# Patient Record
Sex: Female | Born: 1945 | Race: White | Hispanic: No | Smoking: Current every day smoker
Health system: Southern US, Community
[De-identification: ages and names within clinical notes are randomized; demographics above are authoritative.]

## PROBLEM LIST (undated history)

## (undated) DIAGNOSIS — E7800 Pure hypercholesterolemia, unspecified: Secondary | ICD-10-CM

## (undated) DIAGNOSIS — S32009A Unspecified fracture of unspecified lumbar vertebra, initial encounter for closed fracture: Secondary | ICD-10-CM

## (undated) DIAGNOSIS — R079 Chest pain, unspecified: Secondary | ICD-10-CM

## (undated) DIAGNOSIS — R002 Palpitations: Secondary | ICD-10-CM

## (undated) DIAGNOSIS — F17200 Nicotine dependence, unspecified, uncomplicated: Secondary | ICD-10-CM

## (undated) DIAGNOSIS — J449 Chronic obstructive pulmonary disease, unspecified: Secondary | ICD-10-CM

## (undated) DIAGNOSIS — Z8619 Personal history of other infectious and parasitic diseases: Secondary | ICD-10-CM

## (undated) DIAGNOSIS — E119 Type 2 diabetes mellitus without complications: Secondary | ICD-10-CM

## (undated) HISTORY — DX: Pure hypercholesterolemia, unspecified: E78.00

## (undated) HISTORY — DX: Unspecified fracture of unspecified lumbar vertebra, initial encounter for closed fracture: S32.009A

## (undated) HISTORY — DX: Chest pain, unspecified: R07.9

## (undated) HISTORY — DX: Palpitations: R00.2

## (undated) HISTORY — PX: ABDOMINAL HYSTERECTOMY: SHX81

## (undated) HISTORY — DX: Personal history of other infectious and parasitic diseases: Z86.19

## (undated) HISTORY — PX: CHOLECYSTECTOMY: SHX55

## (undated) HISTORY — PX: OTHER SURGICAL HISTORY: SHX169

## (undated) HISTORY — PX: APPENDECTOMY: SHX54

---

## 1999-06-29 ENCOUNTER — Emergency Department (HOSPITAL_COMMUNITY): Admission: EM | Admit: 1999-06-29 | Discharge: 1999-06-29 | Payer: Self-pay | Admitting: Emergency Medicine

## 2006-10-10 ENCOUNTER — Ambulatory Visit (HOSPITAL_COMMUNITY): Payer: Self-pay | Admitting: Psychiatry

## 2006-12-07 ENCOUNTER — Ambulatory Visit (HOSPITAL_COMMUNITY): Payer: Self-pay | Admitting: Psychiatry

## 2007-01-03 ENCOUNTER — Ambulatory Visit (HOSPITAL_COMMUNITY): Payer: Self-pay | Admitting: Psychiatry

## 2007-02-05 ENCOUNTER — Ambulatory Visit (HOSPITAL_COMMUNITY): Payer: Self-pay | Admitting: Psychiatry

## 2007-03-12 ENCOUNTER — Ambulatory Visit (HOSPITAL_COMMUNITY): Payer: Self-pay | Admitting: Psychiatry

## 2007-03-18 ENCOUNTER — Ambulatory Visit (HOSPITAL_COMMUNITY): Payer: Self-pay | Admitting: Psychiatry

## 2007-05-08 ENCOUNTER — Ambulatory Visit (HOSPITAL_COMMUNITY): Payer: Self-pay | Admitting: Psychiatry

## 2007-07-22 ENCOUNTER — Ambulatory Visit (HOSPITAL_COMMUNITY): Payer: Self-pay | Admitting: Psychiatry

## 2007-09-24 ENCOUNTER — Ambulatory Visit (HOSPITAL_COMMUNITY): Payer: Self-pay | Admitting: Psychiatry

## 2007-11-18 ENCOUNTER — Ambulatory Visit (HOSPITAL_COMMUNITY): Payer: Self-pay | Admitting: Psychiatry

## 2008-01-15 ENCOUNTER — Ambulatory Visit (HOSPITAL_COMMUNITY): Payer: Self-pay | Admitting: Psychiatry

## 2008-03-13 ENCOUNTER — Ambulatory Visit (HOSPITAL_COMMUNITY): Payer: Self-pay | Admitting: Psychiatry

## 2009-03-13 ENCOUNTER — Ambulatory Visit: Payer: Self-pay | Admitting: Diagnostic Radiology

## 2009-03-13 ENCOUNTER — Emergency Department (HOSPITAL_BASED_OUTPATIENT_CLINIC_OR_DEPARTMENT_OTHER): Admission: EM | Admit: 2009-03-13 | Discharge: 2009-03-13 | Payer: Self-pay | Admitting: Emergency Medicine

## 2009-03-19 ENCOUNTER — Emergency Department (HOSPITAL_BASED_OUTPATIENT_CLINIC_OR_DEPARTMENT_OTHER): Admission: EM | Admit: 2009-03-19 | Discharge: 2009-03-19 | Payer: Self-pay | Admitting: Emergency Medicine

## 2009-04-28 ENCOUNTER — Emergency Department (HOSPITAL_BASED_OUTPATIENT_CLINIC_OR_DEPARTMENT_OTHER): Admission: EM | Admit: 2009-04-28 | Discharge: 2009-04-28 | Payer: Self-pay | Admitting: Emergency Medicine

## 2009-07-15 ENCOUNTER — Encounter: Admission: RE | Admit: 2009-07-15 | Discharge: 2009-07-15 | Payer: Self-pay | Admitting: Orthopedic Surgery

## 2011-03-07 DIAGNOSIS — Z8619 Personal history of other infectious and parasitic diseases: Secondary | ICD-10-CM

## 2011-03-07 HISTORY — DX: Personal history of other infectious and parasitic diseases: Z86.19

## 2011-04-23 DIAGNOSIS — F411 Generalized anxiety disorder: Secondary | ICD-10-CM | POA: Insufficient documentation

## 2011-09-08 ENCOUNTER — Ambulatory Visit: Payer: Self-pay | Admitting: Family Medicine

## 2011-09-08 DIAGNOSIS — Z0289 Encounter for other administrative examinations: Secondary | ICD-10-CM

## 2011-09-12 ENCOUNTER — Encounter: Payer: Self-pay | Admitting: *Deleted

## 2011-09-12 ENCOUNTER — Emergency Department: Admission: EM | Admit: 2011-09-12 | Discharge: 2011-09-12 | Payer: Medicaid Other | Source: Home / Self Care

## 2011-10-24 ENCOUNTER — Encounter: Payer: Self-pay | Admitting: Cardiology

## 2011-10-24 ENCOUNTER — Encounter: Payer: Self-pay | Admitting: *Deleted

## 2011-10-25 ENCOUNTER — Encounter: Payer: Self-pay | Admitting: Cardiology

## 2011-10-25 ENCOUNTER — Ambulatory Visit (INDEPENDENT_AMBULATORY_CARE_PROVIDER_SITE_OTHER): Payer: Medicaid Other | Admitting: Cardiology

## 2011-10-25 VITALS — BP 112/81 | HR 105 | Ht 68.0 in | Wt 160.0 lb

## 2011-10-25 DIAGNOSIS — F17200 Nicotine dependence, unspecified, uncomplicated: Secondary | ICD-10-CM

## 2011-10-25 DIAGNOSIS — R002 Palpitations: Secondary | ICD-10-CM | POA: Insufficient documentation

## 2011-10-25 DIAGNOSIS — Z720 Tobacco use: Secondary | ICD-10-CM

## 2011-10-25 DIAGNOSIS — R079 Chest pain, unspecified: Secondary | ICD-10-CM

## 2011-10-25 NOTE — Assessment & Plan Note (Signed)
Patient counseled on discontinuing. 

## 2011-10-25 NOTE — Assessment & Plan Note (Signed)
Plan [facility]. 

## 2011-10-25 NOTE — Assessment & Plan Note (Signed)
Symptoms are extremely atypical but electrocardiogram with question previous infarct. Schedule lexiscan myoview for risk stratification.

## 2011-10-25 NOTE — Patient Instructions (Addendum)
Your physician recommends that you schedule a follow-up appointment in: 6 WEEKS WITH DR Jens Som IN [place]  Your physician has requested that you have a lexiscan myoview. For further information please visit [email or web address] Please follow instruction sheet, as given.   Your physician has recommended that you wear an event monitor. Event monitors are medical devices that record the heart's electrical activity. Doctors most often [place] these monitors to diagnose arrhythmias. Arrhythmias are problems with the speed or rhythm of the heartbeat. The monitor is a small, portable device. You can wear one while you do your normal daily activities. This is usually used to diagnose what is causing palpitations/syncope (passing out).

## 2011-10-25 NOTE — Progress Notes (Signed)
  HPI: 66 year old female with no prior cardiac history for evaluation of chest pain and palpitations. Patient describes chest pain for approximately 3 months. It begins in the left breast area and radiates to the right. It is described as a sharp pain with occasional nausea, shortness of breath and diaphoresis. It increases with inspiration and certain movements. It is not related to food or exertion. It lasts 45 seconds and resolves spontaneously. She also has dyspnea on exertion but no orthopnea or PND. Occasional mild pedal edema. She also describes palpitations for one month. These are sudden in onset and described as heart racing. These last 45 seconds and resolve spontaneously. No syncope.  Current Outpatient Prescriptions  Medication Sig Dispense Refill  . ALPRAZolam (XANAX) 1 MG tablet Take 1 mg by mouth as needed.      Marland Kitchen aspirin 81 MG tablet Take 81 mg by mouth daily.      Marland Kitchen HYDROcodone-acetaminophen (VICODIN) 5-500 MG per tablet Take 1 tablet by mouth every 6 (six) hours as needed.      . pregabalin (LYRICA) 150 MG capsule Take 150 mg by mouth 3 (three) times daily.      . QUEtiapine (SEROQUEL) 100 MG tablet Take 100 mg by mouth at bedtime.      . topiramate (TOPAMAX) 200 MG tablet Take 200 mg by mouth daily.         No Known Allergies  Past Medical History  Diagnosis Date  . Hypercholesteremia   . Lumbar vertebral fracture     Past Surgical History  Procedure Date  . Cholecystectomy   . Abdominal hysterectomy   . Appendectomy     History   Social History  . Marital Status: Divorced    Spouse Name: N/A    Number of Children: 3  . Years of Education: N/A   Occupational History  .      Retired   Social History Main Topics  . Smoking status: Current Everyday Smoker  . Smokeless tobacco: Not on file  . Alcohol Use: No  . Drug Use: No  . Sexually Active:    Other Topics Concern  . Not on file   Social History Narrative  . No narrative on file    Family  History  Problem Relation Age of Onset  . Hypertension Father   . Heart disease Sister     CHF    ROS: problems with right arm pain but no fevers or chills, productive cough, hemoptysis, dysphasia, odynophagia, melena, hematochezia, dysuria, hematuria, rash, seizure activity, orthopnea, PND, pedal edema, claudication. Remaining systems are negative.  Physical Exam:   Blood pressure 112/81, pulse 105, height 5\' 8"  (1.727 m), weight 160 lb (72.576 kg).  General:  Well developed/well nourished in NAD Skin warm/dry Patient not depressed No peripheral clubbing Back-normal HEENT-normal/normal eyelids Neck supple/normal carotid upstroke bilaterally; no bruits; no JVD; no thyromegaly chest - CTA/ normal expansion CV - RRR/normal S1 and S2; no murmurs, rubs or gallops;  PMI nondisplaced Abdomen -NT/ND, no HSM, no mass, + bowel sounds, no bruit 2+ femoral pulses, no bruits Ext-no edema, chords, 2+ DP Neuro-grossly nonfocal  ECG sinus rhythm at a rate of 100. Cannot rule out prior inferior posterior lateral infarct. Right axis deviation. Nonspecific ST changes.

## 2011-11-09 ENCOUNTER — Other Ambulatory Visit (HOSPITAL_COMMUNITY): Payer: Medicaid Other

## 2011-12-19 ENCOUNTER — Encounter: Payer: Self-pay | Admitting: *Deleted

## 2011-12-20 ENCOUNTER — Ambulatory Visit: Payer: Medicaid Other | Admitting: Cardiology

## 2012-01-15 ENCOUNTER — Telehealth: Payer: Self-pay | Admitting: *Deleted

## 2012-01-15 NOTE — Telephone Encounter (Signed)
Spoke to Gina Boyd today concerning Myoview/Monitor that was ordered by Dr. Jens Som on 10/25/11. Patient was originally scheduled for testing on 11/09/11 she canceled due to transportation issues. Gina Boyd has decided to put-off testing at this time along with follow-up appointment with [facility]. Gina Boyd is dealing with some family issues right now that takes precedence over everything else. She will call our office if she changes her mind or have any other problems.

## 2012-03-22 ENCOUNTER — Encounter: Payer: Self-pay | Admitting: Family Medicine

## 2012-03-22 ENCOUNTER — Ambulatory Visit (INDEPENDENT_AMBULATORY_CARE_PROVIDER_SITE_OTHER): Payer: Medicare Other | Admitting: Family Medicine

## 2012-03-22 VITALS — BP 119/76 | HR 111 | Resp 20 | Ht 67.25 in | Wt 164.0 lb

## 2012-03-22 DIAGNOSIS — M25559 Pain in unspecified hip: Secondary | ICD-10-CM

## 2012-03-22 DIAGNOSIS — F329 Major depressive disorder, single episode, unspecified: Secondary | ICD-10-CM | POA: Insufficient documentation

## 2012-03-22 DIAGNOSIS — IMO0001 Reserved for inherently not codable concepts without codable children: Secondary | ICD-10-CM

## 2012-03-22 DIAGNOSIS — M79641 Pain in right hand: Secondary | ICD-10-CM

## 2012-03-22 DIAGNOSIS — J42 Unspecified chronic bronchitis: Secondary | ICD-10-CM

## 2012-03-22 DIAGNOSIS — J449 Chronic obstructive pulmonary disease, unspecified: Secondary | ICD-10-CM

## 2012-03-22 DIAGNOSIS — F32A Depression, unspecified: Secondary | ICD-10-CM

## 2012-03-22 DIAGNOSIS — M25551 Pain in right hip: Secondary | ICD-10-CM

## 2012-03-22 DIAGNOSIS — G43109 Migraine with aura, not intractable, without status migrainosus: Secondary | ICD-10-CM

## 2012-03-22 DIAGNOSIS — M797 Fibromyalgia: Secondary | ICD-10-CM | POA: Insufficient documentation

## 2012-03-22 DIAGNOSIS — J4489 Other specified chronic obstructive pulmonary disease: Secondary | ICD-10-CM

## 2012-03-22 DIAGNOSIS — M79644 Pain in right finger(s): Secondary | ICD-10-CM

## 2012-03-22 DIAGNOSIS — M79609 Pain in unspecified limb: Secondary | ICD-10-CM

## 2012-03-22 MED ORDER — HYDROCODONE-ACETAMINOPHEN 5-325 MG PO TABS: 1.0000 | ORAL_TABLET | Freq: Every evening | ORAL | Status: DC | PRN

## 2012-03-22 NOTE — Progress Notes (Signed)
Subjective:    Patient ID: Gina Boyd, female    DOB: 1945-10-03, 67 y.o.   MRN: [id number]  HPI Right hand and right hip pain.  Hand has been painful for one year.  Has had a normal nerve conduction study with Hali Marry, PA with [employer].  Says so painful hard to sleep.  Says pain is mostly dorsum of hand or into the thumb.  No swelling. Will occ radiate into forearm. Says whole hand will go numb at times.  No alleviatintg sxs.  IBu doesn't help anymore.  No trauma.  No swelling.  Has been wearing a wrist brace for 6 mo  Had hip surgery on the right hip.  Says had a knot growing there years ago and they had to remove it.  Says now has a new knot and now so painful she can't lay in the bed. No meds relieve her pain.   Migraines - Well controlled on topamax.  She is on 2 mg per her neurologist.  Depression-she see psychiatry. She is well-controlled on her cervical right now.  COPD-she says her last breathing test that she had was several years ago. She says she's only used inhalers when she gets acutely sick with bronchitis which happens a couple of times a year.   Review of Systems  Constitutional: Negative for fever, diaphoresis and unexpected weight change.  HENT: Negative for hearing loss, rhinorrhea and tinnitus.   Eyes: Negative for visual disturbance.  Respiratory: Positive for cough. Negative for wheezing.   Cardiovascular: Negative for chest pain and palpitations.  Gastrointestinal: Negative for nausea, vomiting, diarrhea and blood in stool.  Genitourinary: Negative for vaginal bleeding, vaginal discharge and difficulty urinating.  Musculoskeletal: Negative for myalgias and arthralgias.  Skin: Negative for rash.  Neurological: Negative for headaches.  Hematological: Negative for adenopathy. Does not bruise/bleed easily.  Psychiatric/Behavioral: Positive for dysphoric mood. Negative for sleep disturbance. The patient is nervous/anxious.    \\ BP  119/76  Pulse 111  Resp 20  Ht 5' 7.25" (1.708 m)  Wt 164 lb (74.39 kg)  BMI 25.50 kg/m2  SpO2 97%    Allergies  Allergen Reactions  . Augmentin (Amoxicillin-Pot Clavulanate) Nausea Only    Past Medical History  Diagnosis Date  . Hypercholesteremia   . Lumbar vertebral fracture   . Chest pain   . Palpitations   . History of shingles 2013    right face    Past Surgical History  Procedure Date  . Cholecystectomy   . Abdominal hysterectomy     for bleeding, Complete hys  . Appendectomy     History   Social History  . Marital Status: Divorced    Spouse Name: N/A    Number of Children: 3  . Years of Education: N/A   Occupational History  . Retired.      Retired   Social History Main Topics  . Smoking status: Current Every Day Smoker -- 1.0 packs/day for 30 years    Types: Cigarettes  . Smokeless tobacco: Never Used  . Alcohol Use: No  . Drug Use: No  . Sexually Active: No   Other Topics Concern  . Not on file   Social History Narrative  . No narrative on file    Family History  Problem Relation Age of Onset  . Hypertension Father   . Diabetes Father   . Heart disease Sister     CHF  . Hyperlipidemia Sister   . Diabetes Sister   .  Colon cancer Mother     colon  . Hyperlipidemia Mother   . COPD Sister     Outpatient Encounter Prescriptions as of 03/22/2012  Medication Sig Dispense Refill  . ALPRAZolam (XANAX) 1 MG tablet Take 1 mg by mouth as needed.      . pregabalin (LYRICA) 150 MG capsule Take 150 mg by mouth 3 (three) times daily.      . QUEtiapine (SEROQUEL) 100 MG tablet Take 100 mg by mouth at bedtime.      . topiramate (TOPAMAX) 200 MG tablet Take 200 mg by mouth daily.       . [DISCONTINUED] aspirin 81 MG tablet Take 81 mg by mouth daily.      . [DISCONTINUED] HYDROcodone-acetaminophen (VICODIN) 5-500 MG per tablet Take 1 tablet by mouth every 6 (six) hours as needed.              Objective:   Physical Exam  Constitutional: She  is oriented to person, place, and time. She appears well-developed and well-nourished.  HENT:  Head: Normocephalic and atraumatic.  Cardiovascular: Normal rate, regular rhythm and normal heart sounds.   Pulmonary/Chest: Effort normal and breath sounds normal.  Musculoskeletal:       Right wrist with no swelling of the wrist or finger joints.  Decreased ROM of the thumb.  Normal flexion and extension of the wrist.  Tender at the base of the thumb.  Positive Finkelstein's test. On her right hip she does have an old surgical scar. She is tender around this area. There is a prominence to the greater trochanter but doesn't feel any different than the left side. Thus I don't palpate a discrete mass.  Neurological: She is alert and oriented to person, place, and time.  Skin: Skin is warm and dry.  Psychiatric: She has a normal mood and affect. Her behavior is normal.          Assessment & Plan:  Right hand pain- possible de Quervain's tenosynovitis area she may also have some significant arthritis in the hand as well. Her pain level is very high. I did give her a small quantity of hydrocodone to use at bedtime since the pain is keeping her awake at night. It also sounds like she is Re: had a normal nerve conduction study. If x-rays are fairly normal then recommend further evaluation by my partner Dr. Rodney Langton.  Right hip pain. - Unclear etiology. She feels like a knot is recurring. It sounds like she may have had benign tumor removed years ago. I don't palpate a discrete mass. I would like for her to see my partner Dr. Rodney Langton for further evaluation. We did get x-rays today.  Migraines - Well controlled.  Continue current regimen per neurology.  Depression- on seroquel.  Well controlled. PHQ-9 score 6.  Followed by psych. GAD-7 score of 7 today.   Tobacco abuse-encouraged cessation. Next  COPD-it sounds like she has not been tested in quite some time. Recommend she follow  up in 3 weeks for spirometry so that we can better risk stratify her and help her establish a treatment regimen. Encourage smoking cessation.  Time spent 45 minutes, greater than 50% of time spent evaluating and counseling on her hand and hip pain, and her COPD.

## 2012-03-22 NOTE — Patient Instructions (Addendum)
Please schedule an appt with  Dr. Rodney Langton  for next week for your hand and your hip

## 2012-04-02 ENCOUNTER — Institutional Professional Consult (permissible substitution): Payer: Medicare Other | Admitting: Sports Medicine

## 2012-04-09 ENCOUNTER — Ambulatory Visit (INDEPENDENT_AMBULATORY_CARE_PROVIDER_SITE_OTHER): Payer: Medicare Other | Admitting: Family Medicine

## 2012-04-09 ENCOUNTER — Encounter: Payer: Self-pay | Admitting: Family Medicine

## 2012-04-09 VITALS — BP 139/90 | HR 116 | Wt 167.0 lb

## 2012-04-09 DIAGNOSIS — M549 Dorsalgia, unspecified: Secondary | ICD-10-CM

## 2012-04-09 DIAGNOSIS — E119 Type 2 diabetes mellitus without complications: Secondary | ICD-10-CM

## 2012-04-09 DIAGNOSIS — R7309 Other abnormal glucose: Secondary | ICD-10-CM

## 2012-04-09 MED ORDER — HYDROCODONE-ACETAMINOPHEN 5-325 MG PO TABS: 1.0000 | ORAL_TABLET | Freq: Every evening | ORAL | Status: DC | PRN

## 2012-04-09 NOTE — Progress Notes (Signed)
CC: Gina Boyd is a 67 y.o. female is here for concerned about DM   Subjective: HPI:  Patient presents with concerns of elevated glucose. Family members have recently been diagnosed with type 2 diabetes and while playing with a glucometer at home she reports readings throughout the day today anywhere between 180 and 250. She's unsure where these numbers stand with regard to mealtimes. She admits in the last week she feels that she's urinating more than normal but without dysuria or urgency. She denies polydipsia polyphagia nor new motor or sensory disturbances other than some burning in her feet late at night.  She believes that she's never been told she has elevated sugar in the past. She denies any major changes to her dietary habits recently. She denies fevers, chills, chest pain, shortness of breath, steroid use, abdominal pain, confusion, nausea.  At the close of our encounter the patient reports that her chronic back pain has severely gotten worse. She tells me she has a history of a compression fracture diagnosed 4 months ago after falling out of a chair. Typically this pain is moderate in severity on a daily basis but today she reports it as 7/10. She denies any recent trauma, she's unable to determine anything that may have exacerbated her pain. She denies saddle paresthesia, bowel or bladder incontinence, nor motor or sensory disturbances other than that described above in the lower extremities. Nothing makes the pain worse is constant throughout the day, Vicodin is the only thing that has helped pain in the past.  Review Of Systems Outlined In HPI  Past Medical History  Diagnosis Date  . Hypercholesteremia   . Lumbar vertebral fracture   . Chest pain   . Palpitations   . History of shingles 2013    right face     Family History  Problem Relation Age of Onset  . Hypertension Father   . Diabetes Father   . Heart disease Sister     CHF  . Hyperlipidemia Sister   . Diabetes  Sister   . Colon cancer Mother     colon  . Hyperlipidemia Mother   . COPD Sister      History  Substance Use Topics  . Smoking status: Current Every Day Smoker -- 1.0 packs/day for 30 years    Types: Cigarettes  . Smokeless tobacco: Never Used  . Alcohol Use: No     Objective: Filed Vitals:   04/09/12 1527  BP: 139/90  Pulse: 116    General: Alert and Oriented, No Acute Distress HEENT: Pupils equal, round, reactive to light. Conjunctivae clear.  Moist mucous membranes, pharynx without inflammation nor lesions.  Neck supple without palpable lymphadenopathy nor abnormal masses. Lungs: Clear to auscultation bilaterally, no wheezing/ronchi/rales.  Comfortable work of breathing. Good air movement. Cardiac: Regular rate and rhythm. Normal S1/S2.  No murmurs, rubs, nor gallops.   Back: Bony spinous process tenderness near L1 with adjacent paraspinal tenderness to palpation, no SI joint tenderness to firm palpation. Extremities: No peripheral edema.  Strong peripheral pulses. L4 and S1 DTRs two over four bilaterally with light touch dermatome sensation intact from S1-L1 bilaterally. Full strength and range of motion in both lower extremities. Mental Status: No depression, anxiety, nor agitation. Skin: Warm and dry.  Assessment & Plan: Nasya was seen today for concerned about dm.  Diagnoses and associated orders for this visit:  Elevated glucose - POCT HgB A1C - BASIC METABOLIC PANEL WITH GFR  Back pain - HYDROcodone-acetaminophen (NORCO/VICODIN) 5-325  MG per tablet; Take 1-2 tablets by mouth at bedtime as needed for pain.  Type 2 diabetes mellitus    Back pain: The patient was encouraged to have lumbar films performed today to evaluate for compression fracture or new bony abnormality. Patient refused. Patient was offered Ultracet for her pain until she can followup with her PCP, patient refused stating this has not helped in the past. Patient requesting 3 weeks of Vicodin,  I refused but offered her approximately 5 days until she can followup with her PCP. Type 2 diabetes: Discussed new diagnosis with the patient and my hopes that she can start metformin, will obtain basic metabolic panel to evaluate her candidacy and will call in prescription tomorrow if not contraindicated.   Return in about 1 week (around 04/16/2012).

## 2012-04-10 ENCOUNTER — Telehealth: Payer: Self-pay | Admitting: Family Medicine

## 2012-04-10 DIAGNOSIS — E119 Type 2 diabetes mellitus without complications: Secondary | ICD-10-CM

## 2012-04-10 LAB — BASIC METABOLIC PANEL WITH GFR
BUN: 10 mg/dL (ref 6–23)
Calcium: 9.4 mg/dL (ref 8.4–10.5)
Creat: 0.73 mg/dL (ref 0.50–1.10)
GFR, Est African American: >89 mL/min
GFR, Est Non African American: 87 mL/min
Glucose, Bld: 110 mg/dL — ABNORMAL HIGH (ref 70–99)
Sodium: 141 mEq/L (ref 135–145)

## 2012-04-10 MED ORDER — METFORMIN HCL ER 750 MG PO TB24: 750.0000 mg | ORAL_TABLET | Freq: Every day | ORAL | Status: DC

## 2012-04-10 NOTE — Telephone Encounter (Signed)
Pt.notified

## 2012-04-10 NOTE — Telephone Encounter (Signed)
Sue Lush, Will you please let Gina Boyd know that her kidney function looks great and I think metformin would be a great start to help control her new dx of diabetes.  I've sent this to her [facility].  I'd encourage her to f/u with Dr. Judie Petit in the next week or two to discuss further diabetes management.

## 2012-04-12 ENCOUNTER — Encounter: Payer: Self-pay | Admitting: *Deleted

## 2012-04-12 ENCOUNTER — Emergency Department (INDEPENDENT_AMBULATORY_CARE_PROVIDER_SITE_OTHER)
Admission: EM | Admit: 2012-04-12 | Discharge: 2012-04-12 | Disposition: A | Discharge status: Other Acute Inpatient Hospital | Payer: Medicare Other | Source: Home / Self Care | Attending: Family Medicine | Admitting: Family Medicine

## 2012-04-12 ENCOUNTER — Ambulatory Visit (INDEPENDENT_AMBULATORY_CARE_PROVIDER_SITE_OTHER): Payer: Medicare Other | Admitting: Sports Medicine

## 2012-04-12 ENCOUNTER — Ambulatory Visit: Payer: Medicare Other | Admitting: Family Medicine

## 2012-04-12 DIAGNOSIS — R000 Tachycardia, unspecified: Secondary | ICD-10-CM

## 2012-04-12 DIAGNOSIS — M25559 Pain in unspecified hip: Secondary | ICD-10-CM

## 2012-04-12 DIAGNOSIS — R040 Epistaxis: Secondary | ICD-10-CM

## 2012-04-12 DIAGNOSIS — M25539 Pain in unspecified wrist: Secondary | ICD-10-CM

## 2012-04-12 DIAGNOSIS — M549 Dorsalgia, unspecified: Secondary | ICD-10-CM

## 2012-04-12 DIAGNOSIS — M25551 Pain in right hip: Secondary | ICD-10-CM | POA: Insufficient documentation

## 2012-04-12 DIAGNOSIS — S0990XA Unspecified injury of head, initial encounter: Secondary | ICD-10-CM

## 2012-04-12 DIAGNOSIS — W19XXXA Unspecified fall, initial encounter: Secondary | ICD-10-CM

## 2012-04-12 DIAGNOSIS — R42 Dizziness and giddiness: Secondary | ICD-10-CM

## 2012-04-12 DIAGNOSIS — M25531 Pain in right wrist: Secondary | ICD-10-CM | POA: Insufficient documentation

## 2012-04-12 LAB — HEMOGLOBIN POC (KUC): Hemoglobin POC: 14.9 g/dL (ref 12–15)

## 2012-04-12 MED ORDER — MELOXICAM 15 MG PO TABS: ORAL_TABLET | ORAL | Status: DC

## 2012-04-12 MED ORDER — PREDNISONE 50 MG PO TABS: ORAL_TABLET | ORAL | Status: DC

## 2012-04-12 NOTE — Assessment & Plan Note (Signed)
Pain is localized over the radiocarpal joint. I going to give her a better wrist brace for further mobilization. Mobic will help. X-rays. She will come back in 3-4 weeks if no better we can consider injection into the radiocarpal joint.

## 2012-04-12 NOTE — Assessment & Plan Note (Signed)
Axial. We will start conservatively with Mobic, formal physical therapy, prednisone. I would like x-rays of her lumbar spine.  I will see her back in 4 weeks for this.

## 2012-04-12 NOTE — ED Provider Notes (Signed)
History     CSN: [id number]  Arrival date & time 04/12/12  1613   First MD Initiated Contact with Patient 04/12/12 1612      Chief Complaint  Patient presents with  . Fall   HPI  Patient presents today with chief complaint of dizziness and fall. Patient was seen next or earlier today for muscular skeletal pain trochanteric bursitis. Patient received a steroid injection of right trochanteric bursa. Patient states that she was coming out of the office into the parking lot she had some low back pain felt dizzy and fell and hit the floor. Patient denies any loss of consciousness. No hemiparesis or confusion. Patient states this is never happened before. Cardiovascular risk factors include diabetes, hyperlipidemia, smoking. Patient struck left-sided of her forehead and her nose when she fell. Has only minor abrasions. Patient denies any chest pains, shortness of breath associated with this episode. Patient is noted to have been seen the past 6-8 months for atypical chest pain. A lexiscan looks like it was ordered however has not been completed.  Past Medical History  Diagnosis Date  . Hypercholesteremia   . Lumbar vertebral fracture   . Chest pain   . Palpitations   . History of shingles 2013    right face    Past Surgical History  Procedure Date  . Cholecystectomy   . Abdominal hysterectomy     for bleeding, Complete hys  . Appendectomy     Family History  Problem Relation Age of Onset  . Hypertension Father   . Diabetes Father   . Heart disease Sister     CHF  . Hyperlipidemia Sister   . Diabetes Sister   . Colon cancer Mother     colon  . Hyperlipidemia Mother   . COPD Sister     History  Substance Use Topics  . Smoking status: Current Every Day Smoker -- 1.0 packs/day for 30 years    Types: Cigarettes  . Smokeless tobacco: Never Used  . Alcohol Use: No    OB History    Grav Para Term Preterm Abortions TAB SAB Ect Mult Living                  Review of  Systems  All other systems reviewed and are negative.    Allergies  Augmentin  Home Medications   Current Outpatient Rx  Name  Route  Sig  Dispense  Refill  . ALPRAZOLAM 1 MG PO TABS   Oral   Take 1 mg by mouth as needed.         Marland Kitchen HYDROCODONE-ACETAMINOPHEN 5-325 MG PO TABS   Oral   Take 1-2 tablets by mouth at bedtime as needed for pain.   10 tablet   0   . MELOXICAM 15 MG PO TABS      One tab PO qAM with breakfast for 2 weeks, then daily prn pain.   30 tablet   3   . METFORMIN HCL ER 750 MG PO TB24   Oral   Take 1 tablet (750 mg total) by mouth daily with breakfast.   30 tablet   0   . PREDNISONE 50 MG PO TABS      One tab PO daily for 5 days.   5 tablet   0   . PREGABALIN 150 MG PO CAPS   Oral   Take 150 mg by mouth 3 (three) times daily.         . QUETIAPINE FUMARATE  100 MG PO TABS   Oral   Take 100 mg by mouth at bedtime.         . TOPIRAMATE 200 MG PO TABS   Oral   Take 200 mg by mouth daily.            BP 135/85  Pulse 123  Resp 18  SpO2 96%  Physical Exam  Constitutional:       Looks older than stated age. Shaking.  HENT:  Right Ear: External ear normal.  Left Ear: External ear normal.  Mouth/Throat: Oropharynx is clear and moist.       Mild nosebleed in bilateral nares.  Eyes: Pupils are equal, round, and reactive to light.  Neck: Normal range of motion. Neck supple. No JVD present.  Cardiovascular: Normal rate, regular rhythm and normal heart sounds.   Pulmonary/Chest: Effort normal. She has wheezes.  Abdominal: Soft.  Musculoskeletal: Normal range of motion.  Neurological: She is alert. No cranial nerve deficit.       Positive generalized tremors. Otherwise no focal abnormalities.  Skin: Skin is warm.  Psychiatric:       Poor eye contact.    ED Course  Procedures (including critical care time)   Labs Reviewed  HEMOGLOBIN POC ([facility])  POCT FASTING CBG [facility] MANUAL ENTRY   No results found.   1. Fall   2.  Dizziness   3. Head trauma   4. Tachycardia   5. Epistaxis     Orders placed during the hospital encounter of 04/12/12  . ED EKG  . ED EKG   EKG: Sinus tachycardia with some nonspecific ST depressions predominantly in the lateral leads of questionable ischemia in comparison to previous tracing.    MDM  Differential symptoms remains relatively broad with neurovascular/cardiogenic sources being higher up on the differential.  hemoglobin at 14.9 and blood sugar today in the 90s are reassuring. There is some questionable new ST depressions in the lateral leads of her EKG comparison with the old. There was some some concern about patient withdrawing from anxiolytics as patient was requesting this from her last visit today per report. Patient is also asking for a refill of her Xanax in my visit with her. Supplemental oxygen in place. Holding on aspirin as patient is actively be bleeding. The chest pain currently. Case discussed with patient as well as sister today. Plan to transfer patient to the ER for further evaluation via EMS.  greater than  60 minutes was spent with patient in terms of direct patient care and care coordination.     The patient and/or caregiver has been counseled thoroughly with regard to treatment plan and/or medications prescribed including dosage, schedule, interactions, rationale for use, and possible side effects and they verbalize understanding. Diagnoses and expected course of recovery discussed and will return if not improved as expected or if the condition worsens. Patient and/or caregiver verbalized understanding.                Doree Albee, MD 04/12/12 [phone]

## 2012-04-12 NOTE — ED Notes (Signed)
Patient fell in [employer] parking lot after a visit with primary care. She reports her back hurts and that is why she lost her balance and fell. She has a shallow abrasion on her forehead that is not bleeding.

## 2012-04-12 NOTE — Assessment & Plan Note (Signed)
Symptoms suggestive of trochanteric bursitis. Injected. Return as needed for this.

## 2012-04-12 NOTE — Progress Notes (Signed)
Subjective:    I'm seeing this patient as a consultation for:  Dr. Linford Arnold  CC: Pain  HPI: Low back pain: Present for many years, has been treated with oral medications in the past but she's never had steroids, physical therapy, more advanced imaging. Pain is localized in the mid lumbar spine, and axial without radiation. Pain is really worse with essentially any movement, not worse with Valsalva, no bowel or bladder changes.  Right wrist pain: Present for a few weeks, wrist is swollen, no injury, worse with wrist extension. Has never had the wrist evaluated before. Pain is localized over the dorsal radiocarpal joint.  Right hip pain: Pain is localized over the greater trochanter, worse with laying on the ipsilateral side, worse with palpation, no radiation.  She did ask for refills on her Xanax and hydrocodone, I asked her to please follow up with her primary care physician regarding these.  Past medical history, Surgical history, Family history not pertinant except as noted below, Social history, Allergies, and medications have been entered into the medical record, reviewed, and no changes needed.   Review of Systems: No headache, visual changes, nausea, vomiting, diarrhea, constipation, dizziness, abdominal pain, skin rash, fevers, chills, night sweats, weight loss, swollen lymph nodes, body aches, joint swelling, muscle aches, chest pain, shortness of breath, mood changes, visual or auditory hallucinations.   Objective:   General: Well Developed, well nourished, and in no acute distress.  Neuro/Psych: Alert and oriented x3, extra-ocular muscles intact, able to move all 4 extremities, sensation grossly intact. Skin: Warm and dry, no rashes noted.  Respiratory: Not using accessory muscles, speaking in full sentences, trachea midline.  Cardiovascular: Pulses palpable, no extremity edema. Abdomen: Does not appear distended. Back Exam:  Inspection: Unremarkable  Motion: Flexion 45  deg, Extension 45 deg, Side Bending to 45 deg bilaterally,  Rotation to 45 deg bilaterally  SLR laying: Negative  XSLR laying: Negative  Palpable tenderness: None. FABER: negative. Sensory change: Gross sensation intact to all lumbar and sacral dermatomes.  Reflexes: 2+ at both patellar tendons, 1+ at achilles tendons, Babinski's downgoing.  Strength at foot  Plantar-flexion: 5/5 Dorsi-flexion: 5/5 Eversion: 5/5 Inversion: 5/5  Leg strength  Quad: 5/5 Hamstring: 5/5 Hip flexor: 5/5 Hip abductors: 5/5  Gait unremarkable. Hip: ROM IR: 45 Deg, ER: 45 Deg, Flexion: 120 Deg, Extension: 100 Deg, Abduction: 45 Deg, Adduction: 45 Deg Strength IR: 5/5, ER: 5/5, Flexion: 5/5, Extension: 5/5, Abduction: 5/5, Adduction: 5/5 Pelvic alignment unremarkable to inspection and palpation. Standing hip rotation and gait without trendelenburg sign / unsteadiness. Tender to palpation over greater trochanter. Well-healed surgical scars present. No pain with FABER or FADIR. No SI joint tenderness and normal minimal SI movement. Right Wrist: Somewhat swollen to inspection. ROM smooth and normal with good flexion and extension and ulnar/radial deviation that is symmetrical with opposite wrist. Tender to palpation over radiocarpal joint. No snuffbox tenderness. No tenderness over Canal of Guyon. Strength 5/5 in all directions without pain. Negative Finkelstein, tinel's and phalens. Negative Watson's test.  Procedure:  Injection of right trochanteric bursa Consent obtained and verified. Time-out conducted. Noted no overlying erythema, induration, or other signs of local infection. Skin prepped in a sterile fashion. Topical analgesic spray: Ethyl chloride. Completed without difficulty. Meds: 1 cc Kenalog 40, 5 cc lidocaine injected in a fanlike pattern just posterior and deep to the greater trochanter. Pain immediately improved suggesting accurate placement of the medication. Advised to call if  fevers/chills, erythema, induration, drainage, or persistent bleeding. Impression  and Recommendations:   This case required medical decision making of moderate complexity.

## 2012-04-22 ENCOUNTER — Ambulatory Visit (INDEPENDENT_AMBULATORY_CARE_PROVIDER_SITE_OTHER): Payer: Medicare Other | Admitting: Physician Assistant

## 2012-04-22 ENCOUNTER — Encounter: Payer: Self-pay | Admitting: Physician Assistant

## 2012-04-22 VITALS — BP 123/84 | HR 117 | Wt 166.0 lb

## 2012-04-22 DIAGNOSIS — M549 Dorsalgia, unspecified: Secondary | ICD-10-CM

## 2012-04-22 DIAGNOSIS — W19XXXA Unspecified fall, initial encounter: Secondary | ICD-10-CM

## 2012-04-22 DIAGNOSIS — Z9181 History of falling: Secondary | ICD-10-CM

## 2012-04-22 MED ORDER — HYDROCODONE-ACETAMINOPHEN 5-325 MG PO TABS: 1.0000 | ORAL_TABLET | Freq: Every evening | ORAL | Status: DC | PRN

## 2012-04-22 MED ORDER — AMBULATORY NON FORMULARY MEDICATION: Status: DC

## 2012-04-22 MED ORDER — DICLOFENAC SODIUM 1 % TD GEL: 4.0000 g | Freq: Four times a day (QID) | TRANSDERMAL | Status: DC

## 2012-04-22 NOTE — Patient Instructions (Addendum)
Fasting in the morning and follow up with Dr. Eppie Gibson in 3 months for Diabetes. Ice area.

## 2012-04-22 NOTE — Progress Notes (Signed)
  Subjective:    Patient ID: Gina Boyd, female    DOB: 30-Oct-1945, 67 y.o.   MRN: [id number]  HPI Patient is a 67 yo female who presents to clinic with sister after fall this am. She reports that she was in the kitchen leaning forward to pick up something and just lost balance and fell forward. She hit her nose but not her head. She was not knocked out. She has not taken xanax of pain pill today. This is the second fall in last couple of months. Denies any dizziness, CP, SOB. She does not have a cane or walker to help her ambulate. Reports that since the fall she has a lot of back pain. Back pain is ongoing and chronic. Worse this morning as far as pain but not due to fall. Back pain does make it harder to bend over and have stablitiy.    Review of Systems     Objective:   Physical Exam  Constitutional: She is oriented to person, place, and time.  Kyphosis.  HENT:  Head: Normocephalic and atraumatic.  Scrape on right side of nostril.  Cardiovascular: Normal rate, regular rhythm and normal heart sounds.   Pulmonary/Chest: Effort normal and breath sounds normal.  Musculoskeletal:  No tenderness over spine. Tenderness over lower paraspinus muscles bilaterally.  Neurological: She is alert and oriented to person, place, and time.  Skin: Skin is warm and dry.  Psychiatric: She has a normal mood and affect. Her behavior is normal.          Assessment & Plan:  Fall/chronic back pain- Orthostatic BP's were great. discussed ways to decrease fall risk at home removing debri. Gave rx for cane to use daily. Discussed xanax and pain rx can increase fall risk per pt not taken today and only takes xanax as needed. Talked about not doing task that cause you to bend, get help. Discussed spinal changes of leaning forward perhaps is causing you to be off balance. Consider low BS but checking and staying above 100.BP is low but not on any BP's meds. Consider adding salt to increase BP a tad that may  make you feel better. Will give a few vicodin but needs to follow up with Dr. Linford Arnold or Dr. Karie Schwalbe for back issues. Did go ahead and give voltaren to see if helps since not able to tolerate mobic due to stomach issues. Feel like would benefit for PT, per pt tried but not able to scheduled.

## 2012-04-23 ENCOUNTER — Telehealth: Payer: Self-pay | Admitting: *Deleted

## 2012-04-23 NOTE — Telephone Encounter (Signed)
Pt calls & wants to know if we can order labs for tsh & lipid.  Has a lump in her neck & thinks it could be goiter.  Please advise

## 2012-04-23 NOTE — Telephone Encounter (Signed)
It is okay to check her thyroid and lipids but if she is feeling a lump or mass in her neck and she really needs to make an appointment so that we can evaluate her and see if she needs more than just a blood test.

## 2012-04-23 NOTE — Telephone Encounter (Signed)
Pt scheduling appt

## 2012-04-25 ENCOUNTER — Encounter: Payer: Self-pay | Admitting: Family Medicine

## 2012-04-25 DIAGNOSIS — M479 Spondylosis, unspecified: Secondary | ICD-10-CM

## 2012-04-26 ENCOUNTER — Ambulatory Visit: Payer: Medicare Other | Admitting: Family Medicine

## 2012-04-30 ENCOUNTER — Encounter: Payer: Self-pay | Admitting: Family Medicine

## 2012-04-30 ENCOUNTER — Ambulatory Visit (INDEPENDENT_AMBULATORY_CARE_PROVIDER_SITE_OTHER): Payer: Medicare Other | Admitting: Family Medicine

## 2012-04-30 VITALS — BP 134/86 | HR 102 | Ht 68.0 in | Wt 169.0 lb

## 2012-04-30 DIAGNOSIS — E119 Type 2 diabetes mellitus without complications: Secondary | ICD-10-CM

## 2012-04-30 DIAGNOSIS — R220 Localized swelling, mass and lump, head: Secondary | ICD-10-CM

## 2012-04-30 DIAGNOSIS — M549 Dorsalgia, unspecified: Secondary | ICD-10-CM

## 2012-04-30 MED ORDER — HYDROCODONE-ACETAMINOPHEN 5-325 MG PO TABS: 1.0000 | ORAL_TABLET | Freq: Every evening | ORAL | Status: DC | PRN
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
## 2012-05-01 ENCOUNTER — Other Ambulatory Visit: Payer: Self-pay | Admitting: Family Medicine

## 2012-05-01 LAB — LIPID PANEL
Total CHOL/HDL Ratio: 5 Ratio
VLDL: 43 mg/dL — ABNORMAL HIGH (ref 0–40)

## 2012-05-01 LAB — CBC WITH DIFFERENTIAL/PLATELET
Basophils Relative: 0 % (ref 0–1)
Eosinophils Absolute: 0.1 10*3/uL (ref 0.0–0.7)
Eosinophils Relative: 1 % (ref 0–5)
HCT: 43.3 % (ref 36.0–46.0)
Hemoglobin: 14.8 g/dL (ref 12.0–15.0)
Lymphs Abs: 2 10*3/uL (ref 0.7–4.0)
MCH: 33.6 pg (ref 26.0–34.0)
MCV: 98.4 fL (ref 78.0–100.0)
Monocytes Absolute: 0.3 10*3/uL (ref 0.1–1.0)
Monocytes Relative: 4 % (ref 3–12)
Platelets: 269 10*3/uL (ref 150–400)
RBC: 4.4 MIL/uL (ref 3.87–5.11)

## 2012-05-01 LAB — TSH: TSH: 1.458 u[IU]/mL (ref 0.350–4.500)

## 2012-05-01 MED ORDER — PRAVASTATIN SODIUM 40 MG PO TABS: 40.0000 mg | ORAL_TABLET | Freq: Every day | ORAL | Status: DC

## 2012-05-02 ENCOUNTER — Encounter: Payer: Self-pay | Admitting: Family Medicine

## 2012-05-02 ENCOUNTER — Ambulatory Visit (INDEPENDENT_AMBULATORY_CARE_PROVIDER_SITE_OTHER): Payer: Medicare Other | Admitting: Family Medicine

## 2012-05-02 VITALS — BP 135/86 | HR 124 | Temp 98.0°F | Wt 172.0 lb

## 2012-05-02 MED ORDER — METHYLPREDNISOLONE SODIUM SUCC 125 MG IJ SOLR: 125.0000 mg | Freq: Once | INTRAMUSCULAR | Status: DC

## 2012-05-02 MED ORDER — ALBUTEROL SULFATE HFA 108 (90 BASE) MCG/ACT IN AERS: 2.0000 | INHALATION_SPRAY | RESPIRATORY_TRACT | Status: DC | PRN

## 2012-05-02 MED ORDER — METHYLPREDNISOLONE SODIUM SUCC 40 MG IJ SOLR: 80.0000 mg | Freq: Once | INTRAMUSCULAR | Status: DC

## 2012-05-02 MED ORDER — AZITHROMYCIN 250 MG PO TABS: ORAL_TABLET | ORAL | Status: DC

## 2012-05-02 MED ORDER — PREDNISONE 20 MG PO TABS: ORAL_TABLET | ORAL | Status: DC

## 2012-05-02 MED ORDER — METHYLPREDNISOLONE SODIUM SUCC 125 MG IJ SOLR
125.0000 mg | Freq: Once | INTRAMUSCULAR | Status: AC
  Administered 2012-05-02: 125 mg via INTRAMUSCULAR

## 2012-05-02 NOTE — Progress Notes (Signed)
CC: Gina Boyd is a 67 y.o. female is here for Cough and Nasal Congestion   Subjective: HPI:  Patient reports 2 days of worsening cough. Seems to be getting worse on a daily basis. Decline occurred suddenly. It is described as a productive cough is present all hours of the day but does not interfere with sleep. Associated with mild shortness of breath and fatigue. Nothing seems to make cough better or worse. She does not have any albuterol home.. Cough is described as moderate to severe in severity. She denies fevers, chills, nausea, vomiting, abdominal pain, chest pain, confusion, motor or sensory disturbances, nor myalgias. Denies orthopnea. No interventions as of yet   Review Of Systems Outlined In HPI  Past Medical History  Diagnosis Date  . Hypercholesteremia   . Lumbar vertebral fracture   . Chest pain   . Palpitations   . History of shingles 2013    right face     Family History  Problem Relation Age of Onset  . Hypertension Father   . Diabetes Father   . Heart disease Sister     CHF  . Hyperlipidemia Sister   . Diabetes Sister   . Colon cancer Mother     colon  . Hyperlipidemia Mother   . COPD Sister      History  Substance Use Topics  . Smoking status: Current Every Day Smoker -- 1.00 packs/day for 30 years    Types: Cigarettes  . Smokeless tobacco: Never Used  . Alcohol Use: No     Objective: Filed Vitals:   05/02/12 1026  BP: 135/86  Pulse: 124  Temp: 98 F (36.7 C)    General: Alert and Oriented, No Acute Distress HEENT: Pupils equal, round, reactive to light. Conjunctivae clear.  External ears unremarkable, canals clear with intact TMs with appropriate landmarks.  Middle ear appears open without effusion. Pink inferior turbinates.  Moist mucous membranes, pharynx without inflammation nor lesions.  Neck supple without palpable lymphadenopathy nor abnormal masses. Lungs: Mild to moderate diffuse rhonchi with trace wheezing without Rales nor  signs of consolidation. Comfortable work of breathing Cardiac: Regular rate and rhythm. Normal S1/S2.  No murmurs, rubs, nor gallops.   Extremities: No peripheral edema.  Strong peripheral pulses.  Mental Status: No depression, anxiety, nor agitation. Skin: Warm and dry.  Assessment & Plan: Terence was seen today for cough and nasal congestion.  Diagnoses and associated orders for this visit:  COPD exacerbation - azithromycin (ZITHROMAX) 250 MG tablet; Take two tabs at once on day 1, then one tab daily on days 2-5. - predniSONE (DELTASONE) 20 MG tablet; Three tabs at once daily for five days. - albuterol (PROVENTIL HFA;VENTOLIN HFA) 108 (90 BASE) MCG/ACT inhaler; Inhale 2 puffs into the lungs every 4 (four) hours as needed for wheezing. - Discontinue: methylPREDNISolone sodium succinate (SOLU-MEDROL) 40 mg/mL injection 80 mg; Inject 2 mLs (80 mg total) into the muscle once. - Discontinue: methylPREDNISolone sodium succinate (SOLU-MEDROL) 125 mg/2 mL injection 125 mg; Inject 2 mLs (125 mg total) into the vein once. - methylPREDNISolone sodium succinate (SOLU-MEDROL) 125 mg/2 mL injection 125 mg; Inject 2 mLs (125 mg total) into the muscle once.    COPD exacerbation: Given one dose of Solu-Medrol and encouraged to start penicillin versed this evening. Start azithromycin. Schedule albuterol every 4 hours for the next 48 hours and then as needed. Encouraged to start guaifenesin products.Signs and symptoms requring emergent/urgent reevaluation were discussed with the patient.  Return if symptoms worsen  or fail to improve.

## 2012-05-06 ENCOUNTER — Ambulatory Visit (INDEPENDENT_AMBULATORY_CARE_PROVIDER_SITE_OTHER): Payer: Medicare Other | Admitting: Family Medicine

## 2012-05-06 ENCOUNTER — Encounter: Payer: Self-pay | Admitting: Family Medicine

## 2012-05-06 VITALS — BP 123/79 | HR 109 | Wt 164.0 lb

## 2012-05-06 MED ORDER — SILVER SULFADIAZINE 1 % EX CREA: TOPICAL_CREAM | CUTANEOUS | Status: DC

## 2012-05-06 MED ORDER — LEVOFLOXACIN 500 MG PO TABS: 500.0000 mg | ORAL_TABLET | Freq: Every day | ORAL | Status: AC

## 2012-05-06 NOTE — Patient Instructions (Addendum)
Do your nebs every 4 hours during the day for about 5 days, then if doing well can decrease to every 6 hours for several days and then wean down.   Start Tudorza (1 inhalation twice a day).   Start the levaquin as well.
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
## 2012-05-07 ENCOUNTER — Other Ambulatory Visit: Payer: Self-pay | Admitting: *Deleted

## 2012-05-07 MED ORDER — METFORMIN HCL ER 750 MG PO TB24: 750.0000 mg | ORAL_TABLET | Freq: Every day | ORAL | Status: DC

## 2012-05-07 NOTE — Telephone Encounter (Signed)
Refilled metformin

## 2012-05-08 ENCOUNTER — Other Ambulatory Visit: Payer: Medicare Other

## 2012-05-15 ENCOUNTER — Ambulatory Visit: Payer: Medicare Other | Admitting: Family Medicine

## 2012-05-16 ENCOUNTER — Encounter: Payer: Self-pay | Admitting: Sports Medicine

## 2012-05-16 NOTE — Progress Notes (Signed)
I denied the request for a lumbosacral orthosis for a number of reasons including failure to do physical therapy, failure to follow up, and lack of evidence for functional outcomes for chronic back pain and back braces.

## 2012-05-17 ENCOUNTER — Telehealth: Payer: Self-pay | Admitting: *Deleted

## 2012-05-17 ENCOUNTER — Ambulatory Visit (INDEPENDENT_AMBULATORY_CARE_PROVIDER_SITE_OTHER): Payer: Medicare Other | Admitting: Family Medicine

## 2012-05-17 DIAGNOSIS — G43909 Migraine, unspecified, not intractable, without status migrainosus: Secondary | ICD-10-CM

## 2012-05-17 MED ORDER — PROMETHAZINE HCL 25 MG/ML IJ SOLN
25.0000 mg | Freq: Once | INTRAMUSCULAR | Status: AC
  Administered 2012-05-17: 25 mg via INTRAMUSCULAR

## 2012-05-17 MED ORDER — KETOROLAC TROMETHAMINE 60 MG/2ML IM SOLN
60.0000 mg | Freq: Once | INTRAMUSCULAR | Status: AC
  Administered 2012-05-17: 60 mg via INTRAMUSCULAR

## 2012-05-17 NOTE — Progress Notes (Signed)
  Subjective:    Patient ID: Gina Boyd, female    DOB: 02-13-1946, 68 y.o.   MRN: [id number]  HPI  Having an acute migraine. She wanted a stronger prescription medication Cardizem however for her to come in and get a Toradol and Phenergan injection as long as she was not driving test. Her sister drove here today.  Review of Systems     Objective:   Physical Exam        Assessment & Plan:  Acute migraine-given Toradol 60 mg and Phenergan 25 mg IM. Patient to go home and rest. If headache persists or a week and then please call back to schedule office visit on Monday.

## 2012-05-17 NOTE — Telephone Encounter (Signed)
Pt notifeid and she will see if can get a ride and she will call back to schedule if she can. Barry Dienes, LPN

## 2012-05-17 NOTE — Telephone Encounter (Signed)
Patient calls and states she has had a bad migraine since yesterday and wanted to know if she could get something for pain. Has tried ibuprofen with no relief.

## 2012-05-17 NOTE — Telephone Encounter (Signed)
Can come in for toradol and phenergan injection but can't drive afterward.

## 2012-05-17 NOTE — Progress Notes (Signed)
  Subjective:    Patient ID: Gina Boyd, female    DOB: March 13, 1945, 67 y.o.   MRN: [id number]   Gina Boyd states she has had a bad migraine since yesterday. Has tried ibuprofen with no relief.    HPI    Review of Systems     Objective:   Physical Exam        Assessment & Plan:

## 2012-05-20 ENCOUNTER — Ambulatory Visit: Payer: Medicare Other | Admitting: Family Medicine

## 2012-05-28 ENCOUNTER — Other Ambulatory Visit: Payer: Self-pay | Admitting: Family Medicine

## 2012-05-28 ENCOUNTER — Ambulatory Visit (INDEPENDENT_AMBULATORY_CARE_PROVIDER_SITE_OTHER): Payer: Medicare Other | Admitting: Family Medicine

## 2012-05-28 ENCOUNTER — Encounter: Payer: Self-pay | Admitting: Family Medicine

## 2012-05-28 VITALS — BP 104/70 | HR 112 | Wt 165.0 lb

## 2012-05-28 DIAGNOSIS — R600 Localized edema: Secondary | ICD-10-CM

## 2012-05-28 DIAGNOSIS — M479 Spondylosis, unspecified: Secondary | ICD-10-CM

## 2012-05-28 DIAGNOSIS — R609 Edema, unspecified: Secondary | ICD-10-CM

## 2012-05-28 DIAGNOSIS — G8929 Other chronic pain: Secondary | ICD-10-CM

## 2012-05-28 DIAGNOSIS — M549 Dorsalgia, unspecified: Secondary | ICD-10-CM

## 2012-05-28 DIAGNOSIS — F329 Major depressive disorder, single episode, unspecified: Secondary | ICD-10-CM

## 2012-05-28 DIAGNOSIS — E119 Type 2 diabetes mellitus without complications: Secondary | ICD-10-CM

## 2012-05-28 DIAGNOSIS — M797 Fibromyalgia: Secondary | ICD-10-CM

## 2012-05-28 DIAGNOSIS — IMO0001 Reserved for inherently not codable concepts without codable children: Secondary | ICD-10-CM

## 2012-05-28 MED ORDER — AMBULATORY NON FORMULARY MEDICATION: Status: AC

## 2012-05-28 MED ORDER — METFORMIN HCL ER (OSM) 1000 MG PO TB24: 1000.0000 mg | ORAL_TABLET | Freq: Every day | ORAL | Status: DC

## 2012-05-28 MED ORDER — PREGABALIN 200 MG PO CAPS: 200.0000 mg | ORAL_CAPSULE | Freq: Three times a day (TID) | ORAL | Status: DC

## 2012-05-28 NOTE — Patient Instructions (Signed)
We will work on controlling your blood sugar.   Bring in your meter with your numbers at the followup visit. I called in an increased dose of metformin. Hopefully this will help bring her numbers back in line. Also refer you for her diabetic nutrition counseling. Keep feet elevated when able. Avoid all ibuprofen products or Aleve or Motrin for at least a week to see if this helps with the swelling. These types medications very frequently can cause ankle swelling. Also make sure you're eating a low salt diet.
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
## 2012-05-29 LAB — COMPLETE METABOLIC PANEL WITH GFR
ALT: 16 U/L (ref 0–35)
AST: 20 U/L (ref 0–37)
Alkaline Phosphatase: 113 U/L (ref 39–117)
BUN: 10 mg/dL (ref 6–23)
Calcium: 9 mg/dL (ref 8.4–10.5)
Chloride: 107 mEq/L (ref 96–112)
Creat: 1.16 mg/dL — ABNORMAL HIGH (ref 0.50–1.10)
Potassium: 4.1 mEq/L (ref 3.5–5.3)

## 2012-05-29 LAB — TSH: TSH: 4.539 u[IU]/mL — ABNORMAL HIGH (ref 0.350–4.500)

## 2012-05-30 ENCOUNTER — Other Ambulatory Visit: Payer: Self-pay | Admitting: Family Medicine

## 2012-05-30 LAB — T4, FREE: Free T4: 1.04 ng/dL (ref 0.80–1.80)

## 2012-06-13 ENCOUNTER — Ambulatory Visit: Payer: Medicare Other | Admitting: Physician Assistant

## 2012-06-13 DIAGNOSIS — Z0289 Encounter for other administrative examinations: Secondary | ICD-10-CM

## 2012-06-14 ENCOUNTER — Ambulatory Visit (INDEPENDENT_AMBULATORY_CARE_PROVIDER_SITE_OTHER): Payer: Medicare Other | Admitting: Physician Assistant

## 2012-06-14 ENCOUNTER — Encounter: Payer: Self-pay | Admitting: Physician Assistant

## 2012-06-14 VITALS — BP 114/75 | HR 106 | Wt 164.0 lb

## 2012-06-14 DIAGNOSIS — R350 Frequency of micturition: Secondary | ICD-10-CM

## 2012-06-14 DIAGNOSIS — R300 Dysuria: Secondary | ICD-10-CM

## 2012-06-14 DIAGNOSIS — R109 Unspecified abdominal pain: Secondary | ICD-10-CM

## 2012-06-14 LAB — POCT URINALYSIS DIPSTICK
Ketones, UA: NEGATIVE
Protein, UA: NEGATIVE
Spec Grav, UA: 1.02
Urobilinogen, UA: 0.2
pH, UA: 6

## 2012-06-14 MED ORDER — TIZANIDINE HCL 2 MG PO CAPS: 2.0000 mg | ORAL_CAPSULE | Freq: Two times a day (BID) | ORAL | Status: DC | PRN

## 2012-06-14 MED ORDER — HYDROCODONE-ACETAMINOPHEN 5-325 MG PO TABS: 1.0000 | ORAL_TABLET | Freq: Four times a day (QID) | ORAL | Status: DC | PRN

## 2012-06-14 NOTE — Patient Instructions (Addendum)
Will order CT scan

## 2012-06-14 NOTE — Progress Notes (Signed)
  Subjective:    Patient ID: Gina Boyd, female    DOB: 12-02-1945, 67 y.o.   MRN: [id number]  HPI Patient presents to the clinic with lower abdominal pain/pressure, dysuria and frequency urination for almost 3 weeks. Per patient she has went to [facility] twice and was given the same abx bactrim and she has taken both rounds and still is having problems. We called [facility] and cultures of urine came back and did not grow anything. She does feel like it is better. She does have some radiation to her back. Denies any fever, chills. She does not have an appetite. Pain in lower abdomen is 8/10 per pt and never had anything like this before. Has had some nausea but no vomiting. Hysterectomy.        Review of Systems     Objective:   Physical Exam  Constitutional: She is oriented to person, place, and time. She appears well-developed and well-nourished.  HENT:  Head: Normocephalic and atraumatic.  Cardiovascular: Normal rate, regular rhythm and normal heart sounds.   Pulmonary/Chest: Effort normal and breath sounds normal.  Abdominal: Soft.  Tender in the suprapubic area. No rebound or guarding.  Neurological: She is alert and oriented to person, place, and time.  Skin: Skin is warm and dry.  Psychiatric: She has a normal mood and affect. Her behavior is normal.          Assessment & Plan:  Urinary symptoms/lower abdominal pain- UA negative for blood, leuks, nitrates. [facility] sent cultures with no growth. Discussed CBC and CT scan but patient declined and stated she would wait until Monday. Will follow up and see if symptoms have resolved. She has had a hysterectomy. Did give some vicodin for the weekend to help with pain.

## 2012-06-19 ENCOUNTER — Telehealth: Payer: Self-pay | Admitting: Family Medicine

## 2012-06-19 ENCOUNTER — Other Ambulatory Visit: Payer: Self-pay | Admitting: Family Medicine

## 2012-06-19 ENCOUNTER — Telehealth: Payer: Self-pay | Admitting: *Deleted

## 2012-06-19 MED ORDER — METFORMIN HCL 1000 MG PO TABS: 1000.0000 mg | ORAL_TABLET | Freq: Two times a day (BID) | ORAL | Status: DC

## 2012-06-19 NOTE — Telephone Encounter (Signed)
Called and informed pt that her Insurance Co will not cover the med. And it will be changed to BID when she is due for refill. Pt voiced understanding.Heath Gold       Please call patient and let her know received a letter from [facility]. They will not cover the extended release form of her metformin so we will need to change it to the metformin 1000 mg twice a day version when she is due for refill.

## 2012-06-19 NOTE — Telephone Encounter (Signed)
Please call patient and let her know received a letter from [facility]. They will not cover the extended release form of her metformin so we will need to change it to the metformin 1000 mg twice a day version when she is due for refill.

## 2012-06-20 ENCOUNTER — Telehealth: Payer: Self-pay | Admitting: Emergency Medicine

## 2012-06-20 ENCOUNTER — Emergency Department (INDEPENDENT_AMBULATORY_CARE_PROVIDER_SITE_OTHER)
Admission: EM | Admit: 2012-06-20 | Discharge: 2012-06-20 | Disposition: A | Discharge status: Home / Self Care | Payer: Medicare Other | Source: Home / Self Care

## 2012-06-20 ENCOUNTER — Encounter: Payer: Self-pay | Admitting: Emergency Medicine

## 2012-06-20 DIAGNOSIS — F411 Generalized anxiety disorder: Secondary | ICD-10-CM

## 2012-06-20 MED ORDER — ALPRAZOLAM 1 MG PO TABS: 1.0000 mg | ORAL_TABLET | Freq: Three times a day (TID) | ORAL | Status: DC | PRN

## 2012-06-20 NOTE — ED Provider Notes (Signed)
History     CSN: [id number]  Arrival date & time 06/20/12  1804   None     Chief Complaint  Patient presents with  . Medication Refill       HPI Comments: Patient states that she normally takes Xanax 1mg , one tab TID, and has misplaced her medication.  Her last dose was 2.5 days ago.  She now feels shaky, nervous, and fatigued.  She has nausea without vomiting.  No chest pain or shortness of breath.  No fevers, chills, and sweats  She last filled a prescription for Xanax 1mg  on 06/05/12 for 108 tabs.  Patient is a 67 y.o. female presenting with anxiety. The history is provided by the patient.  Anxiety This is a chronic problem. The current episode started 2 days ago. The problem occurs constantly. The problem has been gradually worsening. Pertinent negatives include no chest pain, no abdominal pain, no headaches and no shortness of breath. Nothing aggravates the symptoms. Relieved by: xanax.    Past Medical History  Diagnosis Date  . Hypercholesteremia   . Lumbar vertebral fracture   . Chest pain   . Palpitations   . History of shingles 2013    right face    Past Surgical History  Procedure Laterality Date  . Cholecystectomy    . Abdominal hysterectomy      for bleeding, Complete hys  . Appendectomy      Family History  Problem Relation Age of Onset  . Hypertension Father   . Diabetes Father   . Heart disease Sister     CHF  . Hyperlipidemia Sister   . Diabetes Sister   . Colon cancer Mother     colon  . Hyperlipidemia Mother   . COPD Sister     History  Substance Use Topics  . Smoking status: Current Every Day Smoker -- 1.00 packs/day for 30 years    Types: Cigarettes  . Smokeless tobacco: Never Used  . Alcohol Use: No    OB History   Grav Para Term Preterm Abortions TAB SAB Ect Mult Living                  Review of Systems  Respiratory: Negative for shortness of breath.   Cardiovascular: Negative for chest pain.  Gastrointestinal: Negative for  abdominal pain.  Neurological: Negative for headaches.  All other systems reviewed and are negative.    Allergies  Augmentin  Home Medications   Current Outpatient Rx  Name  Route  Sig  Dispense  Refill  . albuterol (PROVENTIL HFA;VENTOLIN HFA) 108 (90 BASE) MCG/ACT inhaler   Inhalation   Inhale 2 puffs into the lungs every 4 (four) hours as needed for wheezing.   1 Inhaler   2   . ALPRAZolam (XANAX) 1 MG tablet   Oral   Take 1 tablet (1 mg total) by mouth 3 (three) times daily as needed.   21 tablet   0   . AMBULATORY NON FORMULARY MEDICATION      Medication Name: glucometer with strips and lancets to test once a day. [id number].250.00   1 vial   0   . diclofenac sodium (VOLTAREN) 1 % GEL   Topical   Apply 4 g topically 4 (four) times daily.   1 Tube   4   . HYDROcodone-acetaminophen (NORCO/VICODIN) 5-325 MG per tablet   Oral   Take 1 tablet by mouth every 6 (six) hours as needed for pain.   20 tablet  0   . metFORMIN (GLUCOPHAGE) 1000 MG tablet   Oral   Take 1 tablet (1,000 mg total) by mouth 2 (two) times daily with a meal.   60 tablet   3   . pravastatin (PRAVACHOL) 40 MG tablet   Oral   Take 1 tablet (40 mg total) by mouth at bedtime.   30 tablet   2   . pregabalin (LYRICA) 200 MG capsule   Oral   Take 1 capsule (200 mg total) by mouth 3 (three) times daily.   90 capsule   2   . QUEtiapine (SEROQUEL) 100 MG tablet   Oral   Take 100 mg by mouth at bedtime.         . tizanidine (ZANAFLEX) 2 MG capsule   Oral   Take 1 capsule (2 mg total) by mouth 2 (two) times daily as needed for muscle spasms.   20 capsule   0   . topiramate (TOPAMAX) 200 MG tablet   Oral   Take 200 mg by mouth daily.          . traMADol (ULTRAM) 50 MG tablet      take 1 tablet by mouth twice a day if needed for pain   60 tablet   0     BP 140/86  Pulse 118  Temp(Src) 98.3 F (36.8 C) (Oral)  Wt 160 lb (72.576 kg)  BMI 24.33 kg/m2  SpO2 95%  Physical  Exam Nursing notes and Vital Signs reviewed. Appearance:  Patient appears older than stated age, and in no acute distress.  She is alert and oriented; a slight resting tremor is present.   Psychiatric:   Thoughts are organized.  No psychomotor retardation.  Memory intact.  Not suicidal.  Affect is flat.  Mood slightly depressed.  Eyes:  Pupils are equal, round, and reactive to light and accomodation.  Extraocular movement is intact.  Conjunctivae are not inflamed  Nose:  Normal Pharynx:  Normal; moist mucous membranes  Neck:  Supple.  No adenopathy Lungs:  Clear to auscultation.  Breath sounds are equal.  Heart:  Regular rate and rhythm without murmurs, rubs, or gallops.  Rate 100 Abdomen:  Nontender without masses or hepatosplenomegaly.  Bowel sounds are present.  No CVA or flank tenderness.  Extremities:  No edema.  No calf tenderness Skin:  No rash present.   ED Course  Procedures  none      1. Anxiety state, unspecified   2. Medication withdrawal       MDM  Refill Xanax 1mg , 1 tab po TID prn, #21, no refill.  Advised to followup with PCP within one week for followup        Lattie Haw, MD 06/20/12 [phone]

## 2012-06-20 NOTE — ED Notes (Signed)
Patient received Xanax rx from Dr Darra Lis on April 1st. Was looking for it yesterday and cannot find it. Last dose was Tuesday night. Woke up at 4am this morning, feels shaky, nervous and nauseated

## 2012-07-03 ENCOUNTER — Ambulatory Visit (INDEPENDENT_AMBULATORY_CARE_PROVIDER_SITE_OTHER): Payer: Medicare Other | Admitting: Family Medicine

## 2012-07-03 ENCOUNTER — Encounter: Payer: Self-pay | Admitting: Family Medicine

## 2012-07-03 VITALS — BP 90/61 | HR 113 | Wt 157.0 lb

## 2012-07-03 DIAGNOSIS — F1910 Other psychoactive substance abuse, uncomplicated: Secondary | ICD-10-CM

## 2012-07-03 DIAGNOSIS — F329 Major depressive disorder, single episode, unspecified: Secondary | ICD-10-CM

## 2012-07-03 DIAGNOSIS — Z765 Malingerer [conscious simulation]: Secondary | ICD-10-CM | POA: Insufficient documentation

## 2012-07-03 DIAGNOSIS — F411 Generalized anxiety disorder: Secondary | ICD-10-CM

## 2012-07-03 MED ORDER — HYDROXYZINE HCL 50 MG PO TABS: 50.0000 mg | ORAL_TABLET | Freq: Three times a day (TID) | ORAL | Status: DC | PRN

## 2012-07-03 NOTE — Progress Notes (Signed)
CC: Gina Boyd is a 67 y.o. female is here for Anxiety   Subjective: HPI:  Patient presents with a chief complaint of sickness. She describes this as nausea and pain throughout her whole body. This is been present ever since running out of Xanax "a few days ago". She would like to know if I would be the primary provider for her mental health medications.  She would like to know if I can provide her with a refill of her Xanax as she believes this is why she is feeling sick.  She tells me her former mental health team including Gina [place] and Gina [place] are no longer providing her with Xanax refills and she is "no longer seeing eye to eye" with that team.  Informed her that it would be best for her to continue seeing a psychiatric specialist for management of her anxiety and depression especially if she felt that many patients like Xanax or required on a daily basis.  I initially offered her a one-week refill of Xanax until she could get back in with her psychiatry team. She immediately requested a refill of Ativan as well to last for the next week. At this point I brought up that this request was a red flag me especially considering she has a history of requesting controlled substances from me on days that she is seeing me for unrelated same-day appointment issues. I bluntly stated to her at this point that I felt benzodiazepines such as Xanax or Ativan or doing her much more harm than good pointing out that a few months ago she fell in the parking lot while accompanying a friend and that these medications increased risk of falls.      Review Of Systems Outlined In HPI  Past Medical History  Diagnosis Date  . Hypercholesteremia   . Lumbar vertebral fracture   . Chest pain   . Palpitations   . History of shingles 2013    right face     Family History  Problem Relation Age of Onset  . Hypertension Father   . Diabetes Father   . Heart disease Sister     CHF  . Hyperlipidemia  Sister   . Diabetes Sister   . Colon cancer Mother     colon  . Hyperlipidemia Mother   . COPD Sister      History  Substance Use Topics  . Smoking status: Current Every Day Smoker -- 1.00 packs/day for 30 years    Types: Cigarettes  . Smokeless tobacco: Never Used  . Alcohol Use: No     Objective: Filed Vitals:   07/03/12 1326  BP: 90/61  Pulse: 113    Vital signs reviewed. General: Alert and Oriented, No Acute Distress HEENT: Pupils equal, round, reactive to light. Conjunctivae clear.  External ears unremarkable.  Moist mucous membranes. Lungs: Clear and comfortable work of breathing, speaking in full sentences without accessory muscle use. Cardiac: Regular rate and rhythm.  Neuro: CN II-XII grossly intact, gait normal. Extremities: No peripheral edema.  Strong peripheral pulses.  Mental Status: No depression, anxiety, nor agitation. Flat affect Skin: Warm and dry.  Assessment & Plan: Gina [place] was seen today for anxiety.  Diagnoses and associated orders for this visit:  Generalized anxiety disorder - hydrOXYzine (ATARAX/VISTARIL) 50 MG tablet; Take 1 tablet (50 mg total) by mouth 3 (three) times daily as needed for anxiety.    Patient was offered hydroxyzine to help with anxiety and strongly encouraged to follow up with  her psychiatry team that a referral will be placed should she desire a psychiatrist that she is a better rapport with. Patient was literally told 10 times that I would not be prescribing any controlled substances despite her wanting to bargain.  (Of note: Patient has a history of requesting controlled substances that are not prescribed by our clinic among 3 of our providers since 2014. She also has a history of not following up on recommendations and treatment plans. Strongly feel that it be doing more harm than good if I prescribe anything with an abuse potential given her behavior.)  1 minutes spent face-to-face during visit today of which at least  50% was counseling or coordinating care regarding generalized anxiety disorder and depression.   Return in about 1 week (around 07/10/2012).

## 2012-07-04 ENCOUNTER — Telehealth: Payer: Self-pay | Admitting: *Deleted

## 2012-07-04 NOTE — Telephone Encounter (Signed)
Pt called and states she has a "horrible uti" and needs something called in. Pt states she does not have a way to get here. Advised pt that she would need an appt before we sent abx or any medication for her. Advised if pt couldn't find a way here during reg business hours urgent care is open until 8 pm. Pt hung the phone up

## 2012-07-08 ENCOUNTER — Ambulatory Visit: Payer: Medicare Other | Admitting: Family Medicine

## 2012-07-08 DIAGNOSIS — Z0289 Encounter for other administrative examinations: Secondary | ICD-10-CM

## 2012-07-22 ENCOUNTER — Ambulatory Visit: Payer: Medicare Other | Admitting: Family Medicine

## 2012-08-14 ENCOUNTER — Ambulatory Visit: Payer: Medicare Other | Admitting: Physician Assistant

## 2012-08-16 ENCOUNTER — Ambulatory Visit: Payer: Medicare Other | Admitting: Physician Assistant

## 2012-08-18 ENCOUNTER — Other Ambulatory Visit: Payer: Self-pay | Admitting: Family Medicine

## 2012-09-10 ENCOUNTER — Ambulatory Visit: Payer: Medicare Other | Admitting: Family Medicine

## 2012-09-12 ENCOUNTER — Ambulatory Visit (INDEPENDENT_AMBULATORY_CARE_PROVIDER_SITE_OTHER): Payer: Medicare Other | Admitting: Family Medicine

## 2012-09-12 ENCOUNTER — Encounter: Payer: Self-pay | Admitting: Family Medicine

## 2012-09-12 VITALS — BP 112/75 | HR 118 | Ht 68.0 in | Wt 157.0 lb

## 2012-09-12 DIAGNOSIS — R946 Abnormal results of thyroid function studies: Secondary | ICD-10-CM

## 2012-09-12 DIAGNOSIS — E119 Type 2 diabetes mellitus without complications: Secondary | ICD-10-CM

## 2012-09-12 DIAGNOSIS — R7989 Other specified abnormal findings of blood chemistry: Secondary | ICD-10-CM

## 2012-09-12 DIAGNOSIS — G629 Polyneuropathy, unspecified: Secondary | ICD-10-CM

## 2012-09-12 DIAGNOSIS — G589 Mononeuropathy, unspecified: Secondary | ICD-10-CM

## 2012-09-12 DIAGNOSIS — R5381 Other malaise: Secondary | ICD-10-CM

## 2012-09-12 DIAGNOSIS — J449 Chronic obstructive pulmonary disease, unspecified: Secondary | ICD-10-CM

## 2012-09-12 DIAGNOSIS — E785 Hyperlipidemia, unspecified: Secondary | ICD-10-CM

## 2012-09-12 DIAGNOSIS — Z23 Encounter for immunization: Secondary | ICD-10-CM

## 2012-09-12 DIAGNOSIS — Z720 Tobacco use: Secondary | ICD-10-CM

## 2012-09-12 DIAGNOSIS — R5383 Other fatigue: Secondary | ICD-10-CM

## 2012-09-12 DIAGNOSIS — N289 Disorder of kidney and ureter, unspecified: Secondary | ICD-10-CM

## 2012-09-12 LAB — POCT UA - MICROALBUMIN
Creatinine, POC: 300 mg/dL
Microalbumin Ur, POC: 80 mg/L

## 2012-09-12 MED ORDER — PRAVASTATIN SODIUM 40 MG PO TABS: 40.0000 mg | ORAL_TABLET | Freq: Every day | ORAL | Status: DC

## 2012-09-12 MED ORDER — PREGABALIN 200 MG PO CAPS: ORAL_CAPSULE | ORAL | Status: DC

## 2012-09-12 MED ORDER — TRAMADOL HCL 50 MG PO TABS: 50.0000 mg | ORAL_TABLET | Freq: Three times a day (TID) | ORAL | Status: DC | PRN

## 2012-09-12 NOTE — Progress Notes (Signed)
  Subjective:    Patient ID: Gina [place], female    DOB: 03/20/1945, 67 y.o.   MRN: [id number]  HPI DM- no hypoglycemic events.  No wounds that aren't healing well.  She says she's taking her metformin without any side effects or problems. She does have some blood work done in [place] and had a few abnormalities.  Still smoking.   Having problems with her feet. She has to wear socks at night bc they are so cold and go numb.  Happening x 3 months;.   No trauma. Hx of chronic back pain w/ spondylosis.    Has had an upper and lower endoscopy about a week ago.  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    COPD-stable she has not had any recent flares. She's happy with her current regimen.  Review of Systems     Objective:   Physical Exam  Constitutional: She is oriented to person, place, and time. She appears well-developed and well-nourished.  HENT:  Head: Normocephalic and atraumatic.  Cardiovascular: Normal rate, regular rhythm and normal heart sounds.   Pulmonary/Chest: Effort normal and breath sounds normal.  Neurological: She is alert and oriented to person, place, and time.  Skin: Skin is warm and dry.  Psychiatric: She has a normal mood and affect. Her behavior is normal.          Assessment & Plan:  DM- Well controlled.  Due for microalbumin.  On statin.  Due for foot exam.  Not on ACEi. NO renal disease.  Says she is due for eye exam next month. F/U in 3 months.  Lab Results  Component Value Date   HGBA1C 5.7 09/12/2012   Tob abuse -Encouraged cessation.   Neuropathy - Abnormal monofilament exam today.  Now onset of numbness only at night. Could be from her back, positional, since onlyh seems to happen at night. Will check B12 and folic acid levels.  If normal then consider further w/u.  Suspect some element of diabetic peripheral neuropathy as well. In fact she is Re: on Lyrica. We did refill that for her today.  COPD-stable.  Abnormal  kidney function-recheck creatinine today. There was a jump in her creatinine last [place] and she's due to recheck that today.  Abnormal thyroid level. Her TSH was off but her T4 and T3 were normal. We are repeating that today.

## 2012-09-13 LAB — VITAMIN B12: Vitamin B-12: 328 pg/mL (ref 211–911)

## 2012-09-13 LAB — BASIC METABOLIC PANEL WITH GFR
CO2: 22 mEq/L (ref 19–32)
Calcium: 9.5 mg/dL (ref 8.4–10.5)
Chloride: 105 mEq/L (ref 96–112)
Creat: 0.84 mg/dL (ref 0.50–1.10)
Glucose, Bld: 96 mg/dL (ref 70–99)
Sodium: 140 mEq/L (ref 135–145)

## 2012-09-13 LAB — T3, FREE: T3, Free: 3 pg/mL (ref 2.3–4.2)

## 2012-09-13 LAB — TSH: TSH: 1.748 u[IU]/mL (ref 0.350–4.500)

## 2012-09-30 ENCOUNTER — Encounter: Payer: Self-pay | Admitting: Family Medicine

## 2012-09-30 ENCOUNTER — Ambulatory Visit (INDEPENDENT_AMBULATORY_CARE_PROVIDER_SITE_OTHER): Payer: Medicare HMO | Admitting: Family Medicine

## 2012-09-30 VITALS — BP 116/71 | HR 97 | Wt 160.0 lb

## 2012-09-30 DIAGNOSIS — R062 Wheezing: Secondary | ICD-10-CM

## 2012-09-30 DIAGNOSIS — R5383 Other fatigue: Secondary | ICD-10-CM

## 2012-09-30 DIAGNOSIS — J441 Chronic obstructive pulmonary disease with (acute) exacerbation: Secondary | ICD-10-CM

## 2012-09-30 DIAGNOSIS — E119 Type 2 diabetes mellitus without complications: Secondary | ICD-10-CM

## 2012-09-30 DIAGNOSIS — T50905A Adverse effect of unspecified drugs, medicaments and biological substances, initial encounter: Secondary | ICD-10-CM

## 2012-09-30 DIAGNOSIS — M791 Myalgia, unspecified site: Secondary | ICD-10-CM

## 2012-09-30 DIAGNOSIS — IMO0001 Reserved for inherently not codable concepts without codable children: Secondary | ICD-10-CM

## 2012-09-30 DIAGNOSIS — M549 Dorsalgia, unspecified: Secondary | ICD-10-CM

## 2012-09-30 DIAGNOSIS — E1149 Type 2 diabetes mellitus with other diabetic neurological complication: Secondary | ICD-10-CM

## 2012-09-30 MED ORDER — GLIPIZIDE 5 MG PO TABS: 5.0000 mg | ORAL_TABLET | Freq: Two times a day (BID) | ORAL | Status: DC

## 2012-09-30 MED ORDER — ALBUTEROL SULFATE HFA 108 (90 BASE) MCG/ACT IN AERS: 2.0000 | INHALATION_SPRAY | RESPIRATORY_TRACT | Status: DC | PRN

## 2012-09-30 NOTE — Progress Notes (Signed)
  Subjective:    Patient ID: Gina [place], female    DOB: Mar 06, 1946, 67 y.o.   MRN: [id number]  HPI  The past 2 weeks hasn't been able to get out of bed bc of bodyaches. Felt like had the flu but no fever or cough.  She has had HA as well.  Has too sick to move. Feels nauseated.  Says the metformin really upsets her stomach and can't take it.  She stopped it yesterday. No constipation or diarrhea. No ST.  No ear pain. Still smokes.  No swollen glands. No chest pain or change in shortness of breath. She has not needed to use her albuterol. In fact she is completely out of medication and needs a refill sent today.  She is also asking to be put on chronic pain medications today and asked to be put on a pain contract if need be. She says she just hurts all over. Affecting her quality of life. She mostly has problems with her back. The last time she saw an orthopedist was a little over a year ago. She says at that time she was told that she was not bad enough to have surgery. She does have peripheral neuropathy and is on Lyrica. She says the pain that radiates into her legs as severe.  Review of Systems     Objective:   Physical Exam  Constitutional: She is oriented to person, place, and time. She appears well-developed and well-nourished.  HENT:  Head: Normocephalic and atraumatic.  Cardiovascular: Normal rate, regular rhythm and normal heart sounds.   Pulmonary/Chest: Effort normal and breath sounds normal.  Expiratory wheezing on the left   Neurological: She is alert and oriented to person, place, and time.  Skin: Skin is warm and dry.  Psychiatric: She has a normal mood and affect. Her behavior is normal.          Assessment & Plan:   Myalgias - Stop the pravastatin for now. Certainly this could be the culprit. We'll also stop the metformin since causing excessive GI upset as well. We'll also check a CK to rule out breakdown of the muscle tissue. We'll check a thyroid as well as a  CBC to evaluate for infection.  COPD/Wheezing-she is wheezing on exam today. She denies any increase shortness of breath but she does have a history of COPD. Would like to get a chest x-ray and set her up for spirometry in about 2 weeks. She is a heavy smoker. Encourage cessation.  DM- stop the metformin.  Will change to to glipizide. Hopefully she will tolerate this little bit better. We'll keep regularly scheduled appointment for diabetes followup. 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 do feel that she has some true pain. She is on Lyrica currently and is not controlling her symptoms. I do not feel comfortable at this point putting her on chronic narcotics I would refer her to a pain management clinic. She previously saw Dr. Oneal Grout. She says she was discharged for missing appointments.

## 2012-09-30 NOTE — Patient Instructions (Signed)
Stop your metformin and your pravastatin.

## 2012-10-01 ENCOUNTER — Other Ambulatory Visit: Payer: Self-pay | Admitting: Family Medicine

## 2012-10-01 ENCOUNTER — Telehealth: Payer: Self-pay | Admitting: *Deleted

## 2012-10-01 LAB — URINALYSIS
Nitrite: NEGATIVE
Specific Gravity, Urine: 1.018 (ref 1.005–1.030)
Urobilinogen, UA: 0.2 mg/dL (ref 0.0–1.0)

## 2012-10-01 LAB — COMPLETE METABOLIC PANEL WITH GFR
CO2: 22 mEq/L (ref 19–32)
Creat: 0.71 mg/dL (ref 0.50–1.10)
GFR, Est African American: >89 mL/min
GFR, Est Non African American: >89 mL/min
Glucose, Bld: 119 mg/dL — ABNORMAL HIGH (ref 70–99)
Total Bilirubin: 0.2 mg/dL — ABNORMAL LOW (ref 0.3–1.2)

## 2012-10-01 LAB — CBC WITH DIFFERENTIAL/PLATELET
Basophils Relative: 0 % (ref 0–1)
Eosinophils Absolute: 0.1 10*3/uL (ref 0.0–0.7)
Hemoglobin: 13.9 g/dL (ref 12.0–15.0)
MCH: 32.5 pg (ref 26.0–34.0)
MCHC: 33.3 g/dL (ref 30.0–36.0)
Monocytes Relative: 4 % (ref 3–12)
Neutrophils Relative %: 69 % (ref 43–77)

## 2012-10-01 LAB — SEDIMENTATION RATE: Sed Rate: 4 mm/hr (ref 0–22)

## 2012-10-01 LAB — CK: Total CK: 29 U/L (ref 7–177)

## 2012-10-01 LAB — TSH: TSH: 0.906 u[IU]/mL (ref 0.350–4.500)

## 2012-10-01 MED ORDER — TRAMADOL HCL 50 MG PO TABS: 50.0000 mg | ORAL_TABLET | Freq: Three times a day (TID) | ORAL | Status: DC | PRN

## 2012-10-01 NOTE — Telephone Encounter (Signed)
I will call in tramadol.

## 2012-10-01 NOTE — Telephone Encounter (Signed)
We are working on getting her into pain management.  I still dont' have records from her ortho that she says she saw a year ago to justify pain meds.

## 2012-10-01 NOTE — Telephone Encounter (Signed)
Pt calls and states was seen yesterday and thought a pain med was going to be called in for her but it was not at pharmacy.  She states she really needs something for pain

## 2012-10-01 NOTE — Telephone Encounter (Signed)
Pt notified and asks if you would please give her something until she can see pain management as she can't hardly get out of bed her legs and head are hurting so bad.

## 2012-10-02 NOTE — Telephone Encounter (Signed)
Pt notified. Jamie Hafford, LPN  

## 2012-10-07 ENCOUNTER — Telehealth: Payer: Self-pay | Admitting: *Deleted

## 2012-10-07 MED ORDER — GLIPIZIDE 10 MG PO TABS: 10.0000 mg | ORAL_TABLET | Freq: Two times a day (BID) | ORAL | Status: DC

## 2012-10-07 NOTE — Telephone Encounter (Signed)
LM for pt to return call for instruction. Barry Dienes, LPN

## 2012-10-07 NOTE — Telephone Encounter (Signed)
Increase glipizide to 10 mg BID

## 2012-10-07 NOTE — Telephone Encounter (Signed)
Pt calls and states her sugar level is high even on glipizide 5mg  twice a day.  SUgars running 153-200mg  range. Please advise

## 2012-10-08 NOTE — Telephone Encounter (Signed)
Pt called back today stating that her sugar this morning was 211.  She states that she did not get the message to return call to increase glipizide to 10mg  BID so I advised her to go ahead & start that today & to call back if her sugar continued to be that elevated.

## 2012-10-11 ENCOUNTER — Encounter: Payer: Self-pay | Admitting: Physician Assistant

## 2012-10-11 ENCOUNTER — Ambulatory Visit (INDEPENDENT_AMBULATORY_CARE_PROVIDER_SITE_OTHER): Payer: Medicare Other | Admitting: Physician Assistant

## 2012-10-11 ENCOUNTER — Telehealth: Payer: Self-pay | Admitting: *Deleted

## 2012-10-11 ENCOUNTER — Telehealth: Payer: Self-pay | Admitting: Physician Assistant

## 2012-10-11 VITALS — BP 129/79 | HR 107 | Wt 159.0 lb

## 2012-10-11 DIAGNOSIS — J209 Acute bronchitis, unspecified: Secondary | ICD-10-CM

## 2012-10-11 DIAGNOSIS — J441 Chronic obstructive pulmonary disease with (acute) exacerbation: Secondary | ICD-10-CM

## 2012-10-11 MED ORDER — MOXIFLOXACIN HCL 400 MG PO TABS: 400.0000 mg | ORAL_TABLET | Freq: Every day | ORAL | Status: DC

## 2012-10-11 MED ORDER — LEVOFLOXACIN 500 MG PO TABS: 500.0000 mg | ORAL_TABLET | Freq: Every day | ORAL | Status: DC

## 2012-10-11 MED ORDER — TRAMADOL HCL 50 MG PO TABS: 50.0000 mg | ORAL_TABLET | Freq: Three times a day (TID) | ORAL | Status: DC | PRN

## 2012-10-11 MED ORDER — PREDNISONE 50 MG PO TABS: ORAL_TABLET | ORAL | Status: DC

## 2012-10-11 MED ORDER — METHYLPREDNISOLONE SODIUM SUCC 125 MG IJ SOLR
125.0000 mg | Freq: Once | INTRAMUSCULAR | Status: AC
  Administered 2012-10-11: 125 mg via INTRAMUSCULAR

## 2012-10-11 NOTE — Telephone Encounter (Signed)
Patient has office visit today at 1:00. This can be addressed during her visit.

## 2012-10-11 NOTE — Telephone Encounter (Signed)
[facility] called..tried to fill script for Avelox generically and cost is still over $100.00 plus

## 2012-10-11 NOTE — Progress Notes (Signed)
  Subjective:    Patient ID: Gina [place], female    DOB: 12-11-1945, 67 y.o.   MRN: [id number]  HPI Patient is a 67 yo female who presents to the clinic with cough, SOB, wheezing. She has a history of COPD. Her sister was sick on Monday with URI. She started feeling bad 3 days ago. She is using her inhaler 3-4 times a day. She just used inhaler before today's visit. She has tried left doxycyline that she had with no help. Mucinex is helping some. She denies any fever, chills. She has a hx of needing multiple rounds of abx to help.    Review of Systems     Objective:   Physical Exam  Constitutional: She is oriented to person, place, and time. She appears well-developed and well-nourished.  HENT:  Head: Normocephalic and atraumatic.  Right Ear: External ear normal.  Left Ear: External ear normal.  Eyes: Conjunctivae are normal.  Neck: Normal range of motion. Neck supple.  Cardiovascular: Regular rhythm and normal heart sounds.   Tachycardia at 107 with recheck.   Pulmonary/Chest:  Decreased effort. Dry to wet hacking cough with deep breathing. Wheezing/rales and rhonchi throughout both lungs.   Lymphadenopathy:    She has no cervical adenopathy.  Neurological: She is alert and oriented to person, place, and time.  Skin: Skin is warm and dry. She is not diaphoretic.  Psychiatric: She has a normal mood and affect. Her behavior is normal.          Assessment & Plan:  COPD exacerbation/acute bronchitis- Recheck pulse and had gone down. Gave solumedrol 125mg  IM. Pt did not want to take oral prednisone because of the way it makes her feel. I told her to give it 24 hours and if not improving to start. Avelox was given for 5 days. Continue to use albuterol every 4-6 hours as needed. Follow up 1 week for recheck.

## 2012-10-11 NOTE — Patient Instructions (Signed)
Given solumedrol 125mg . avelox sent to pharmacy.  Continue using albuterol every 4-6 hours as needed.  I know you do not want to use prednisone orally but you may need to start if not improving in next 24 hours.   Follow up in 1 week for recheck.

## 2012-10-11 NOTE — Telephone Encounter (Signed)
Ok let's try levaquin. Sent to pharm.

## 2012-10-11 NOTE — Telephone Encounter (Signed)
Pt calls back today after increasing her diabetes med and states that her sugars are still high.  This morning sugar was 204

## 2012-10-14 ENCOUNTER — Other Ambulatory Visit: Payer: Medicare Other | Admitting: Family Medicine

## 2012-10-16 ENCOUNTER — Encounter: Payer: Self-pay | Admitting: Physician Assistant

## 2012-10-16 ENCOUNTER — Ambulatory Visit (INDEPENDENT_AMBULATORY_CARE_PROVIDER_SITE_OTHER): Payer: Medicare Other | Admitting: Physician Assistant

## 2012-10-16 VITALS — BP 106/71 | HR 121 | Wt 148.0 lb

## 2012-10-16 DIAGNOSIS — J441 Chronic obstructive pulmonary disease with (acute) exacerbation: Secondary | ICD-10-CM

## 2012-10-16 MED ORDER — METHYLPREDNISOLONE SODIUM SUCC 125 MG IJ SOLR
125.0000 mg | Freq: Once | INTRAMUSCULAR | Status: AC
  Administered 2012-10-16: 125 mg via INTRAMUSCULAR

## 2012-10-16 MED ORDER — LEVOFLOXACIN 500 MG PO TABS: 500.0000 mg | ORAL_TABLET | Freq: Every day | ORAL | Status: DC

## 2012-10-16 NOTE — Patient Instructions (Addendum)
Start Tudorza 1 puff twice a day. Continue using nebulizer every 4 hours and slowly start to wean off in next week. Get chest xray. I will give levaquin for 5 more days.

## 2012-10-16 NOTE — Progress Notes (Signed)
  Subjective:    Patient ID: Gina [place], female    DOB: 1945/11/26, 67 y.o.   MRN: [id number]  HPI Pt presents to the clinic still SOB and not feeling well. She was seen in clinic 5 days ago and given Levaquin for 7 days and she took only 3 days of prednisone. Prednisone makes her feel so anxious. She does not like to take.  She feels a little less tight in chest but continues to use albuterol 3-4 times a day. Her head is full of pressure. She feels like she is always running a low grade fever.      Review of Systems     Objective:   Physical Exam  Constitutional: She is oriented to person, place, and time. She appears well-developed and well-nourished.  HENT:  Head: Normocephalic and atraumatic.  Right Ear: External ear normal.  Left Ear: External ear normal.  TM's clear.  PND on oropharynx.  Bilateral nasal turbinates red and swollen.  Eyes: Conjunctivae are normal. Right eye exhibits no discharge. Left eye exhibits no discharge.  Neck: Normal range of motion. Neck supple.  Cardiovascular: Regular rhythm and normal heart sounds.   Tachycardia at 121.   Pulmonary/Chest:  Decreased effort with coarse breath sounds. No wheezing heard today.   Lymphadenopathy:    She has no cervical adenopathy.  Neurological: She is alert and oriented to person, place, and time.  Skin: Skin is warm and dry.  Psychiatric: She has a normal mood and affect. Her behavior is normal.          Assessment & Plan:  COPd exacerbation- I suspect HR up due to frequent albuterol. At this point would like to get chest xray. Did give 5 more days of levaquin. Pt not on daily inhaler. I gave pt sample of tudorza and taught her how to use first dose. Told her to take twice a day 1 puff. Continue to use albuterol as needed but try to start weaning off. Consider mucinex to help with sinus/head congestion. Call if symptoms worsening or not improving.

## 2012-10-18 ENCOUNTER — Ambulatory Visit (INDEPENDENT_AMBULATORY_CARE_PROVIDER_SITE_OTHER): Payer: Medicare Other | Admitting: Physician Assistant

## 2012-10-18 ENCOUNTER — Encounter: Payer: Self-pay | Admitting: Physician Assistant

## 2012-10-18 VITALS — BP 120/77 | HR 110 | Wt 154.0 lb

## 2012-10-18 DIAGNOSIS — G43909 Migraine, unspecified, not intractable, without status migrainosus: Secondary | ICD-10-CM

## 2012-10-18 MED ORDER — KETOROLAC TROMETHAMINE 60 MG/2ML IM SOLN
60.0000 mg | Freq: Once | INTRAMUSCULAR | Status: AC
  Administered 2012-10-18: 60 mg via INTRAMUSCULAR

## 2012-10-18 NOTE — Progress Notes (Signed)
  Subjective:    Patient ID: Gina [place], female    DOB: 07-Aug-1945, 67 y.o.   MRN: [id number]  HPI Patient presents to the clinic with migraine from all the coughing. She has had ongoing headache for 2 days. This is an ongoing problem for patient. She is on preventative topamax. Denies any n/v.     Review of Systems     Objective:   Physical Exam  Pulmonary/Chest:  Pulse ox 93 percent. Coarse breath sounds. Wheezing, rhonchi still present.           Assessment & Plan:  Migraine- Gave shot of toradol 60mg .omig in office.Also administered Zomig intranasal today. Call if not improving.   COPD exacerbation- told pt to get chest xray today. Pulse ox 93 percent. Continue on levaquin. Continue taking tudorza.

## 2012-10-21 ENCOUNTER — Ambulatory Visit (INDEPENDENT_AMBULATORY_CARE_PROVIDER_SITE_OTHER): Payer: Medicare Other | Admitting: Physician Assistant

## 2012-10-21 ENCOUNTER — Encounter: Payer: Self-pay | Admitting: Physician Assistant

## 2012-10-21 ENCOUNTER — Telehealth: Payer: Self-pay | Admitting: Physician Assistant

## 2012-10-21 ENCOUNTER — Ambulatory Visit: Payer: Medicare Other | Admitting: Physician Assistant

## 2012-10-21 ENCOUNTER — Ambulatory Visit (INDEPENDENT_AMBULATORY_CARE_PROVIDER_SITE_OTHER): Payer: Medicare Other

## 2012-10-21 VITALS — BP 118/78 | HR 123 | Temp 98.6°F | Resp 20 | Wt 158.0 lb

## 2012-10-21 DIAGNOSIS — M940 Chondrocostal junction syndrome [Tietze]: Secondary | ICD-10-CM

## 2012-10-21 DIAGNOSIS — R079 Chest pain, unspecified: Secondary | ICD-10-CM

## 2012-10-21 DIAGNOSIS — J449 Chronic obstructive pulmonary disease, unspecified: Secondary | ICD-10-CM

## 2012-10-21 DIAGNOSIS — R509 Fever, unspecified: Secondary | ICD-10-CM

## 2012-10-21 DIAGNOSIS — Z720 Tobacco use: Secondary | ICD-10-CM

## 2012-10-21 DIAGNOSIS — R0602 Shortness of breath: Secondary | ICD-10-CM

## 2012-10-21 DIAGNOSIS — I498 Other specified cardiac arrhythmias: Secondary | ICD-10-CM

## 2012-10-21 DIAGNOSIS — R05 Cough: Secondary | ICD-10-CM

## 2012-10-21 DIAGNOSIS — R000 Tachycardia, unspecified: Secondary | ICD-10-CM

## 2012-10-21 MED ORDER — CARVEDILOL 3.125 MG PO TABS: 3.1250 mg | ORAL_TABLET | Freq: Two times a day (BID) | ORAL | Status: DC

## 2012-10-21 NOTE — Progress Notes (Signed)
  Subjective:    Patient ID: Gina Boyd, female    DOB: [birth year], 67 y.o.   MRN: [id number]  HPI Patient presents to the clinic with left sided chest pain that has worsened over the past 3 days. Chest pain in records has been ongoing for over a year. She saw Dr. Jens Som but never had any imaging done and never followed up. Describes chest pains today as dull constant ache. Denies any numbness or tingling going down arm. Denies any nausea/vomiting.  Last week she was seen multiple times for COPD exacerbation.Chest xray was ordered but she never got it done. She only completed 3 days of prednisone due to palpitations. She has one more day left of levaquin. She does feel like cough and congestion is better and almost back to base line. She continue taking tudorza sample also.   She comes in questioning pain management appt.    Review of Systems     Objective:   Physical Exam  Constitutional: She is oriented to person, place, and time. She appears well-developed and well-nourished.  HENT:  Head: Normocephalic and atraumatic.  Cardiovascular: Normal rate, regular rhythm and normal heart sounds.   Tachycardia at 123.  Pulmonary/Chest: Effort normal.  Continue to have coarse breath sounds bilaterally. A few rhonchi at base of both lungs.   There was tenderness to palpation over left sided chest wall.   Neurological: She is alert and oriented to person, place, and time.  Skin: Skin is warm and dry.  Psychiatric: She has a normal mood and affect. Her behavior is normal.          Assessment & Plan:  Chest pain/tachycardia-EKG sinus tachycardia, there were no non-specific signs of ischemia in lateral and inferior leads.t wave inverted in V1, v2. Viewed last EKG in february and had not changed.   I suspect recent exacerbation of COPD causing costochondritis. Gave Hand out for patient. Told to ice. Since coughing has improved and not able to tolerate cough syrup encouraged ibuprofen.  Since pain has been ongoing. Will get back in with Dr. Jens Som and see if he wants to reorder any test for further evaluation. Since past test were not done. Encouraged pt to get chest xray today. She has not been compliant with this.   I feel like prednisone in a full course would really help her symptoms however pulse rate is too high today. I gave carvedilol to start to see if could get HR down. Recheck with PCP in 1 month.    Chronic pain- Will get front desk to call pt with update of status on pain clinic appt.

## 2012-10-21 NOTE — Telephone Encounter (Signed)
Pt came in today wanting to know status of pain clinic referral. Could someone call her back and let her know where we are at with that and who she should be expecting a call from.

## 2012-10-21 NOTE — Patient Instructions (Addendum)
Will get you back with [facility].   Costochondritis Costochondritis (Tietze syndrome), or costochondral separation, is a swelling and irritation (inflammation) of the tissue (cartilage) that connects your ribs with your breastbone (sternum). It may occur on its own (spontaneously), through damage caused by an accident (trauma), or simply from coughing or minor exercise. It may take up to 6 weeks to get better and longer if you are unable to be conservative in your activities. HOME CARE INSTRUCTIONS   Avoid exhausting physical activity. Try not to strain your ribs during normal activity. This would include any activities using chest, belly (abdominal), and side muscles, especially if heavy weights are used.  Use ice for 15-20 minutes per hour while awake for the first 2 days. Place the ice in a plastic bag, and place a towel between the bag of ice and your skin.  Only take over-the-counter or prescription medicines for pain, discomfort, or fever as directed by your caregiver. SEEK IMMEDIATE MEDICAL CARE IF:   Your pain increases or you are very uncomfortable.  You have a fever.  You develop difficulty with your breathing.  You cough up blood.  You develop worse chest pains, shortness of breath, sweating, or vomiting.  You develop new, unexplained problems (symptoms). MAKE SURE YOU:   Understand these instructions.  Will watch your condition.  Will get help right away if you are not doing well or get worse. Document Released: 11/30/2004 Document Revised: 05/15/2011 Document Reviewed: 10/09/2007 [facility] Patient Information 2014 [place], [place].

## 2012-10-22 ENCOUNTER — Telehealth: Payer: Self-pay

## 2012-10-22 ENCOUNTER — Encounter: Payer: Self-pay | Admitting: *Deleted

## 2012-10-22 NOTE — Telephone Encounter (Signed)
Patient informed of recommendations  

## 2012-10-22 NOTE — Telephone Encounter (Signed)
Make sure doing nebs every 4 hours at home.

## 2012-10-22 NOTE — Telephone Encounter (Signed)
Patient called stated that she was seen in office on yesterday for cough and congestion, she stated that she is not getting any better she wants to know if there is anything  that she can do to get some relief.

## 2012-10-23 ENCOUNTER — Telehealth: Payer: Self-pay

## 2012-10-23 ENCOUNTER — Other Ambulatory Visit: Payer: Self-pay | Admitting: Physician Assistant

## 2012-10-23 DIAGNOSIS — R911 Solitary pulmonary nodule: Secondary | ICD-10-CM

## 2012-10-23 NOTE — Telephone Encounter (Signed)
Patient stated that she has a viral infection, she stated that she is not doing better, having shortness of breath and coughing. She thinks she is having anxiety attacks.

## 2012-10-23 NOTE — Telephone Encounter (Signed)
Call pt: Those are not typical anxiety symptoms. Since on Tudorza let's add Breo to the mix. One puff once a day. Give samples for her to try first. Use in combination with [place].

## 2012-10-25 ENCOUNTER — Other Ambulatory Visit (HOSPITAL_BASED_OUTPATIENT_CLINIC_OR_DEPARTMENT_OTHER): Payer: Self-pay | Admitting: *Deleted

## 2012-10-28 ENCOUNTER — Telehealth: Payer: Self-pay | Admitting: Family Medicine

## 2012-10-28 NOTE — Telephone Encounter (Signed)
?  pain management?

## 2012-10-28 NOTE — Telephone Encounter (Signed)
Yes ma'am, its for pain management.

## 2012-10-28 NOTE — Telephone Encounter (Signed)
Pt stopped by to find out what was status of referral and was told that she has been denied.  This is the second time referral has been denied..please advise.

## 2012-11-01 ENCOUNTER — Encounter: Payer: Self-pay | Admitting: Family Medicine

## 2012-11-01 ENCOUNTER — Telehealth: Payer: Self-pay | Admitting: *Deleted

## 2012-11-01 NOTE — Telephone Encounter (Signed)
Ok to refill on Tuesday 

## 2012-11-01 NOTE — Telephone Encounter (Signed)
Pharmacy calls stating that pt calls them wanting to get an early refill on her Lyrica.  On 09/14/2012 got 90, then on 10/10/2012 got 90.  6 days early.  Please advise

## 2012-11-05 MED ORDER — PREGABALIN 200 MG PO CAPS: ORAL_CAPSULE | ORAL | Status: DC

## 2012-11-07 ENCOUNTER — Ambulatory Visit: Payer: Medicare Other | Admitting: Family Medicine

## 2012-11-07 ENCOUNTER — Encounter: Payer: Self-pay | Admitting: Family Medicine

## 2012-11-07 DIAGNOSIS — M810 Age-related osteoporosis without current pathological fracture: Secondary | ICD-10-CM | POA: Insufficient documentation

## 2012-11-10 ENCOUNTER — Other Ambulatory Visit: Payer: Self-pay | Admitting: Family Medicine

## 2012-11-12 ENCOUNTER — Encounter: Payer: Self-pay | Admitting: Family Medicine

## 2012-11-12 ENCOUNTER — Ambulatory Visit (INDEPENDENT_AMBULATORY_CARE_PROVIDER_SITE_OTHER): Payer: Medicare Other | Admitting: Family Medicine

## 2012-11-12 VITALS — BP 114/72 | HR 110 | Wt 160.0 lb

## 2012-11-12 DIAGNOSIS — M545 Low back pain, unspecified: Secondary | ICD-10-CM

## 2012-11-12 DIAGNOSIS — J441 Chronic obstructive pulmonary disease with (acute) exacerbation: Secondary | ICD-10-CM

## 2012-11-12 DIAGNOSIS — M542 Cervicalgia: Secondary | ICD-10-CM

## 2012-11-12 DIAGNOSIS — Z720 Tobacco use: Secondary | ICD-10-CM

## 2012-11-12 DIAGNOSIS — E119 Type 2 diabetes mellitus without complications: Secondary | ICD-10-CM

## 2012-11-12 DIAGNOSIS — F17200 Nicotine dependence, unspecified, uncomplicated: Secondary | ICD-10-CM

## 2012-11-12 DIAGNOSIS — J209 Acute bronchitis, unspecified: Secondary | ICD-10-CM

## 2012-11-12 DIAGNOSIS — G8929 Other chronic pain: Secondary | ICD-10-CM

## 2012-11-12 MED ORDER — HYDROCODONE-ACETAMINOPHEN 5-325 MG PO TABS: 1.0000 | ORAL_TABLET | Freq: Two times a day (BID) | ORAL | Status: DC | PRN

## 2012-11-12 MED ORDER — AMBULATORY NON FORMULARY MEDICATION: Status: AC

## 2012-11-12 NOTE — Progress Notes (Signed)
Subjective:    Patient ID: Gina Boyd, female    DOB: Aug 01, 1945, 67 y.o.   MRN: [id number]  HPI Here for hospital followup from [facility]. She was diagnosed with a COPD exacerbation.She was there for 3 days.  Has been trying to drink fluids since she has been home. Says dec mucous production and overall feeling much better. She denies any wheezing in the last couple of days.. No fever.  They did put her on Advair which she has been using. She says she has plenty of medication and does not need refills today. She did complete her antibiotics and her prednisone. She was placed on nicotine patches during her hospitalization but is smoking again since being home.  Lumbar spine pain and chronic pain medications.  Had a lumbar frature about a year ago.  Says her neuropathy is severe as well.  Says did PT for her back as well. Would like something stronger than IBU or Tylenol. She has been trying to help her sister who's been recently ill as well and says this is been bothering her back even more. She is Re: on Lyrica for her neuropathy.   Review of Systems  BP 114/72  Pulse 110  Wt 160 lb (72.576 kg)  BMI 24.33 kg/m2  SpO2 96%    Allergies  Allergen Reactions  . Augmentin [Amoxicillin-Pot Clavulanate] Nausea Only  . Metformin And Related Other (See Comments)    GI intolerance.     Past Medical History  Diagnosis Date  . Hypercholesteremia   . Lumbar vertebral fracture   . Chest pain   . Palpitations   . History of shingles 2013    right face    Past Surgical History  Procedure Laterality Date  . Cholecystectomy    . Abdominal hysterectomy      for bleeding, Complete hys  . Appendectomy      History   Social History  . Marital Status: Divorced    Spouse Name: N/A    Number of Children: 3  . Years of Education: N/A   Occupational History  . Retired.      Retired   Social History Main Topics  . Smoking status: Current Every Day Smoker -- 1.00 packs/day  for 30 years    Types: Cigarettes  . Smokeless tobacco: Never Used  . Alcohol Use: No  . Drug Use: No  . Sexual Activity: No   Other Topics Concern  . Not on file   Social History Narrative   No regular exercise. 2 caffeine drinks per day. She's not sexually active. Currently lives with her sister Lavonna Monarch and brother-in-law.    Family History  Problem Relation Age of Onset  . Hypertension Father   . Diabetes Father   . Heart disease Sister     CHF  . Hyperlipidemia Sister   . Diabetes Sister   . Colon cancer Mother     colon  . Hyperlipidemia Mother   . COPD Sister     Outpatient Encounter Prescriptions as of 11/12/2012  Medication Sig Dispense Refill  . benzonatate (TESSALON PERLES) 100 MG capsule 200 mg. Take 2 capsules (200 mg total) by mouth 3 (three) times a day as needed for Cough.      . Fluticasone-Salmeterol (ADVAIR DISKUS) 250-50 MCG/DOSE AEPB Inhale 1 puff into the lungs every 12 (twelve) hours.      Marland Kitchen albuterol (PROVENTIL HFA;VENTOLIN HFA) 108 (90 BASE) MCG/ACT inhaler Inhale 2 puffs into the lungs every 4 (  four) hours as needed for wheezing.  1 Inhaler  2  . AMBULATORY NON FORMULARY MEDICATION Medication Name: glucometer with strips and lancets to test once a day. [id number]  1 vial  0  . AMBULATORY NON FORMULARY MEDICATION Medication Name: shingles vaccine IM x 1  1 vial  0  . carvedilol (COREG) 3.125 MG tablet Take 1 tablet (3.125 mg total) by mouth 2 (two) times daily with a meal.  60 tablet  1  . glipiZIDE (GLUCOTROL) 10 MG tablet Take 1 tablet (10 mg total) by mouth 2 (two) times daily before a meal.  60 tablet  2  . HYDROcodone-acetaminophen (NORCO/VICODIN) 5-325 MG per tablet Take 1 tablet by mouth 2 (two) times daily as needed for pain.  60 tablet  0  . hydrOXYzine (ATARAX/VISTARIL) 50 MG tablet Take 1 tablet (50 mg total) by mouth 3 (three) times daily as needed for anxiety.  45 tablet  0  . pravastatin (PRAVACHOL) 40 MG tablet Take 1 tablet (40 mg  total) by mouth at bedtime.  30 tablet  3  . pregabalin (LYRICA) 200 MG capsule take 1 capsule by mouth three times a day  90 capsule  2  . QUEtiapine (SEROQUEL) 100 MG tablet Take 100 mg by mouth at bedtime.      . topiramate (TOPAMAX) 200 MG tablet Take 200 mg by mouth daily.       . traMADol (ULTRAM) 50 MG tablet Take 1 tablet (50 mg total) by mouth every 8 (eight) hours as needed for pain.  60 tablet  0  . [DISCONTINUED] levofloxacin (LEVAQUIN) 500 MG tablet Take 1 tablet (500 mg total) by mouth daily. For 7 days.  5 tablet  0  . [DISCONTINUED] metFORMIN (GLUCOPHAGE) 1000 MG tablet take 1 tablet by mouth twice a day with food  60 tablet  3  . [DISCONTINUED] predniSONE (DELTASONE) 50 MG tablet Take one tab for 5 days.  5 tablet  0   No facility-administered encounter medications on file as of 11/12/2012.          Objective:   Physical Exam  Constitutional: She is oriented to person, place, and time. She appears well-developed and well-nourished.  HENT:  Head: Normocephalic and atraumatic.  Cardiovascular: Normal rate, regular rhythm and normal heart sounds.   Pulmonary/Chest: Effort normal. She has wheezes.  Slight expiratory wheeze in the left upper posterior lung.  Neurological: She is alert and oriented to person, place, and time.  Skin: Skin is warm and dry.  Psychiatric: She has a normal mood and affect. Her behavior is normal.          Assessment & Plan:  COPD exacerbation/acute bronchitis - doing well overall. She is much better.  Continue current regimen with Advair. Please call if suddenly feels like she's getting worse.  Encourage smoking cessation.  Chronic Back Pain - we have tried to get into a pain clinic but can't get her in anywhere. I do believe that she has real pain. We'll go ahead and prescribe her a small quantity of hydrocodone to take up to twice a day. Encouraged her to still use her ibuprofen as her baseline medications for pain. Can use hydrocodone as  needed. Her that do not prescribe large quantities of narcotics and that we would have to get her in with the pain management center somewhere even if we have to go out of her local area if she feels that she needs more pain control than that. We will have  her sign a pain contract today and she is quite aware of that.  Also recommended shingles vaccine. Prescription given today. She will need to get to her pharmacy since it is covered under Medicare part D.  Diabetes-her monitor she is due for followup in one month. She's also due for eye exam encouraged her to schedule her for this fall.

## 2012-11-13 ENCOUNTER — Ambulatory Visit: Payer: Medicare Other | Admitting: Cardiology

## 2012-11-13 ENCOUNTER — Encounter: Payer: Self-pay | Admitting: Family Medicine

## 2012-11-13 DIAGNOSIS — R911 Solitary pulmonary nodule: Secondary | ICD-10-CM | POA: Insufficient documentation

## 2012-11-21 ENCOUNTER — Ambulatory Visit: Payer: Medicare Other | Admitting: Family Medicine

## 2012-11-22 ENCOUNTER — Other Ambulatory Visit: Payer: Self-pay | Admitting: Family Medicine

## 2012-11-22 MED ORDER — PREGABALIN 200 MG PO CAPS: ORAL_CAPSULE | ORAL | Status: DC

## 2012-11-28 ENCOUNTER — Encounter: Payer: Self-pay | Admitting: Family Medicine

## 2012-12-04 ENCOUNTER — Encounter: Payer: Self-pay | Admitting: Physician Assistant

## 2012-12-04 ENCOUNTER — Ambulatory Visit (INDEPENDENT_AMBULATORY_CARE_PROVIDER_SITE_OTHER): Payer: Medicare Other | Admitting: Physician Assistant

## 2012-12-04 ENCOUNTER — Encounter: Payer: Self-pay | Admitting: *Deleted

## 2012-12-04 VITALS — BP 118/80 | HR 112 | Wt 152.0 lb

## 2012-12-04 DIAGNOSIS — N949 Unspecified condition associated with female genital organs and menstrual cycle: Secondary | ICD-10-CM

## 2012-12-04 DIAGNOSIS — F1910 Other psychoactive substance abuse, uncomplicated: Secondary | ICD-10-CM

## 2012-12-04 DIAGNOSIS — Z765 Malingerer [conscious simulation]: Secondary | ICD-10-CM

## 2012-12-04 DIAGNOSIS — R102 Pelvic and perineal pain: Secondary | ICD-10-CM

## 2012-12-04 MED ORDER — TRAMADOL HCL 50 MG PO TABS: 50.0000 mg | ORAL_TABLET | Freq: Three times a day (TID) | ORAL | Status: DC | PRN
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
## 2012-12-05 ENCOUNTER — Telehealth: Payer: Self-pay | Admitting: *Deleted

## 2012-12-05 NOTE — Telephone Encounter (Signed)
Pt called today to let you know that she is not feeling any better today at all.  FYI

## 2012-12-06 NOTE — Telephone Encounter (Signed)
Left message with Bronson Ing to have pt return my call.

## 2012-12-06 NOTE — Telephone Encounter (Signed)
Well she never got abdominal xray done to look for kidney stones. She could come do that as well as get a cbc just to make sure not elevated.

## 2012-12-09 ENCOUNTER — Ambulatory Visit (INDEPENDENT_AMBULATORY_CARE_PROVIDER_SITE_OTHER): Payer: Medicare Other

## 2012-12-09 ENCOUNTER — Telehealth: Payer: Self-pay | Admitting: *Deleted

## 2012-12-09 DIAGNOSIS — R102 Pelvic and perineal pain: Secondary | ICD-10-CM

## 2012-12-09 DIAGNOSIS — N2889 Other specified disorders of kidney and ureter: Secondary | ICD-10-CM

## 2012-12-09 DIAGNOSIS — R109 Unspecified abdominal pain: Secondary | ICD-10-CM

## 2012-12-09 NOTE — Telephone Encounter (Signed)
Cbc ordered & faxed downstairs to [facility].

## 2012-12-10 LAB — CBC WITH DIFFERENTIAL/PLATELET
Basophils Absolute: 0 10*3/uL (ref 0.0–0.1)
Basophils Relative: 0 % (ref 0–1)
Eosinophils Absolute: 0.1 10*3/uL (ref 0.0–0.7)
Eosinophils Relative: 2 % (ref 0–5)
HCT: 39.4 % (ref 36.0–46.0)
MCHC: 32.5 g/dL (ref 30.0–36.0)
MCV: 99 fL (ref 78.0–100.0)
Monocytes Absolute: 0.5 10*3/uL (ref 0.1–1.0)
RDW: 13.5 % (ref 11.5–15.5)

## 2012-12-11 ENCOUNTER — Telehealth: Payer: Self-pay | Admitting: Family Medicine

## 2012-12-11 ENCOUNTER — Other Ambulatory Visit: Payer: Self-pay | Admitting: Physician Assistant

## 2012-12-11 DIAGNOSIS — R102 Pelvic and perineal pain: Secondary | ICD-10-CM

## 2012-12-11 NOTE — Telephone Encounter (Signed)
Called and informed pt that her labs were normal. Pt stated that her pain is not getting any better pt stated that she is having pelvic pain and would like to be referred to a specialist about this. She stated that she would like for jade to call her back.Loralee Pacas [place]

## 2012-12-11 NOTE — Telephone Encounter (Signed)
Tried calling 10/8 at 3:21pm multiple rings with no answer.

## 2012-12-11 NOTE — Telephone Encounter (Signed)
Called patient. Pt aware CBC and abdominal xray normal. Pain reports pain is 10/10 and is very low in her urtheral region. She has been to urology but not done anything. At this time I will order CT of pelvis. Pt has pain clinic appt on Friday since our office will not give anymore pain medication.  CT appt in afternoon if possible.

## 2012-12-11 NOTE — Telephone Encounter (Signed)
Pt wants to know results from bloodwork done last week.

## 2012-12-13 ENCOUNTER — Ambulatory Visit: Payer: Medicare Other | Admitting: Family Medicine

## 2012-12-16 ENCOUNTER — Ambulatory Visit (INDEPENDENT_AMBULATORY_CARE_PROVIDER_SITE_OTHER): Payer: Medicare Other | Admitting: Family Medicine

## 2012-12-16 ENCOUNTER — Encounter: Payer: Self-pay | Admitting: Family Medicine

## 2012-12-16 VITALS — BP 99/66 | HR 104 | Wt 150.0 lb

## 2012-12-16 DIAGNOSIS — M542 Cervicalgia: Secondary | ICD-10-CM

## 2012-12-16 DIAGNOSIS — G8929 Other chronic pain: Secondary | ICD-10-CM

## 2012-12-16 DIAGNOSIS — Z79899 Other long term (current) drug therapy: Secondary | ICD-10-CM

## 2012-12-16 DIAGNOSIS — R109 Unspecified abdominal pain: Secondary | ICD-10-CM

## 2012-12-16 DIAGNOSIS — M549 Dorsalgia, unspecified: Secondary | ICD-10-CM

## 2012-12-16 LAB — CREATININE, SERUM: Creat: 0.67 mg/dL (ref 0.50–1.10)

## 2012-12-16 MED ORDER — TRAMADOL-ACETAMINOPHEN 37.5-325 MG PO TABS: 1.0000 | ORAL_TABLET | Freq: Two times a day (BID) | ORAL | Status: DC | PRN
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
## 2012-12-17 ENCOUNTER — Ambulatory Visit (HOSPITAL_BASED_OUTPATIENT_CLINIC_OR_DEPARTMENT_OTHER): Payer: Medicare Other

## 2012-12-17 ENCOUNTER — Other Ambulatory Visit: Payer: Medicare Other

## 2012-12-17 LAB — DRUG SCREEN, URINE
Benzodiazepines.: POSITIVE — AB
Cocaine Metabolites: NEGATIVE
Creatinine,U: 141.51 mg/dL
Marijuana Metabolite: NEGATIVE
Phencyclidine (PCP): NEGATIVE
Propoxyphene: NEGATIVE

## 2012-12-24 NOTE — Telephone Encounter (Signed)
Pt called & left message asking for a rheumatology referral.

## 2012-12-24 NOTE — Telephone Encounter (Signed)
Lets hold on rheumatology referral until she has her pain clinic appointment as well as gets her CT scan done.

## 2012-12-25 ENCOUNTER — Other Ambulatory Visit: Payer: Self-pay | Admitting: Family Medicine

## 2012-12-25 DIAGNOSIS — F411 Generalized anxiety disorder: Secondary | ICD-10-CM

## 2012-12-26 NOTE — Telephone Encounter (Signed)
I don't have anything to say to that.
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
## 2012-12-30 ENCOUNTER — Ambulatory Visit: Payer: Medicare Other | Admitting: Physician Assistant

## 2013-01-03 ENCOUNTER — Emergency Department (INDEPENDENT_AMBULATORY_CARE_PROVIDER_SITE_OTHER): Payer: Medicare Other

## 2013-01-03 ENCOUNTER — Emergency Department (INDEPENDENT_AMBULATORY_CARE_PROVIDER_SITE_OTHER)
Admission: EM | Admit: 2013-01-03 | Discharge: 2013-01-03 | Disposition: A | Discharge status: Home / Self Care | Payer: Medicare Other | Source: Home / Self Care | Attending: Emergency Medicine | Admitting: Emergency Medicine

## 2013-01-03 ENCOUNTER — Encounter: Payer: Self-pay | Admitting: Emergency Medicine

## 2013-01-03 DIAGNOSIS — M25559 Pain in unspecified hip: Secondary | ICD-10-CM

## 2013-01-03 DIAGNOSIS — M25551 Pain in right hip: Secondary | ICD-10-CM

## 2013-01-03 MED ORDER — HYDROCODONE-ACETAMINOPHEN 5-325 MG PO TABS: 1.0000 | ORAL_TABLET | Freq: Four times a day (QID) | ORAL | Status: DC | PRN

## 2013-01-03 NOTE — ED Notes (Signed)
Rt hip pain x 6 wks, hx of tumor

## 2013-01-03 NOTE — ED Provider Notes (Signed)
CSN: [id number]     Arrival date & time 01/03/13  0848 History   First MD Initiated Contact with Patient 01/03/13 [id number]     Chief Complaint  Patient presents with  . Hip Pain   (Consider location/radiation/quality/duration/timing/severity/associated sxs/prior Treatment) Patient is a 67 y.o. female presenting with hip pain. The history is provided by the patient.  Hip Pain This is a recurrent problem. Episode onset: 6 weeks ago, but much worse the past 3 days.--She recalls no specific injury recently. The problem occurs constantly. The problem has been gradually worsening. Pertinent negatives include no chest pain, no abdominal pain, no headaches and no shortness of breath. Associated symptoms comments: No radiation down her leg. Denies any associated weakness or numbness of her leg. The symptoms are aggravated by standing and walking (But she is able to weight-bear). Nothing relieves the symptoms. She has tried acetaminophen for the symptoms. The treatment provided no relief.   she states that the right hip pain is stabbing and severe, and she requests Vicodin  PCP is Dr. Linford Arnold. 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 By Dr. Salome Arnt, her orthopedist in [place]. She states she has not seen Dr. Marlyne Beards recently and does not have an appointment there at this time.  She also has history of fibromyalgia.--On chronic pain medications, please see list below. I questioned her carefully about medications. She states that she has run out of hydrocodone and requests more Vicodin for the pain.--She denies that she's taking any other pain medicine currently such as tramadol.  Reviewing her chart, the CT of pelvis was ordered by her PCP on 12/11/2012, but that has not been done yet.  Past Medical History  Diagnosis Date  . Hypercholesteremia   . Lumbar vertebral fracture   . Chest pain   . Palpitations   . History of shingles 2013    right  face   Past Surgical History  Procedure Laterality Date  . Cholecystectomy    . Abdominal hysterectomy      for bleeding, Complete hys  . Appendectomy    . 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 Hypertension Father   . Diabetes Father   . Heart disease Sister     CHF  . Hyperlipidemia Sister   . Diabetes Sister   . Colon cancer Mother     colon  . Hyperlipidemia Mother   . COPD Sister    History  Substance Use Topics  . Smoking status: Current Every Day Smoker -- 1.00 packs/day for 30 years    Types: Cigarettes  . Smokeless tobacco: Never Used  . Alcohol Use: No   OB History   Grav Para Term Preterm Abortions TAB SAB Ect Mult Living                 Review of Systems  Respiratory: Negative for shortness of breath.   Cardiovascular: Negative for chest pain.  Gastrointestinal: Negative for abdominal pain.  Neurological: Negative for headaches.  All other systems reviewed and are negative.    Allergies  Augmentin and Metformin and related  Home Medications   Current Outpatient Rx  Name  Route  Sig  Dispense  Refill  . albuterol (PROVENTIL HFA;VENTOLIN HFA) 108 (90 BASE) MCG/ACT inhaler   Inhalation   Inhale 2 puffs into the lungs every 4 (four) hours as needed for wheezing.   1 Inhaler  2   . AMBULATORY NON FORMULARY MEDICATION      Medication Name: glucometer with strips and lancets to test once a day. [id number].250.00   1 vial   0   . AMBULATORY NON FORMULARY MEDICATION      Medication Name: shingles vaccine IM x 1   1 vial   0   . carvedilol (COREG) 3.125 MG tablet   Oral   Take 1 tablet (3.125 mg total) by mouth 2 (two) times daily with a meal.   60 tablet   1   . EXPIRED: chlorproMAZINE (THORAZINE) 25 MG tablet      25 mg.         . clonazePAM (KLONOPIN) 0.5 MG tablet   Oral   Take 0.5 mg by mouth 2 (two) times daily as needed for anxiety (Rx by psych).         . cyclobenzaprine (FLEXERIL) 10 MG  tablet      take 1 tablet by mouth every 8 hours if needed for muscle spasm         . dicyclomine (BENTYL) 20 MG tablet      20 mg.         . Fluticasone-Salmeterol (ADVAIR DISKUS) 250-50 MCG/DOSE AEPB   Inhalation   Inhale 1 puff into the lungs every 12 (twelve) hours.         Marland Kitchen glipiZIDE (GLUCOTROL) 10 MG tablet   Oral   Take 1 tablet (10 mg total) by mouth 2 (two) times daily before a meal.   60 tablet   2   . HYDROcodone-acetaminophen (NORCO/VICODIN) 5-325 MG per tablet   Oral   Take 1-2 tablets by mouth every 6 (six) hours as needed for pain. Take with food.   10 tablet   0   . hydrOXYzine (ATARAX/VISTARIL) 50 MG tablet   Oral   Take 1 tablet (50 mg total) by mouth 3 (three) times daily as needed for anxiety.   45 tablet   0   . pravastatin (PRAVACHOL) 40 MG tablet   Oral   Take 1 tablet (40 mg total) by mouth at bedtime.   30 tablet   3   . pregabalin (LYRICA) 200 MG capsule      take 1 capsule by mouth three times a day   90 capsule   0     Ok to refill early since meds were stolen   . QUEtiapine (SEROQUEL) 100 MG tablet   Oral   Take 100 mg by mouth at bedtime.         . topiramate (TOPAMAX) 200 MG tablet   Oral   Take 200 mg by mouth daily.          Marland Kitchen zolpidem (AMBIEN) 10 MG tablet   Oral   Take 10 mg by mouth at bedtime as needed for sleep (rx by psych).          BP 115/79  Pulse 107  Temp(Src) 97.4 F (36.3 C) (Oral)  Ht 5\' 8"  (1.727 m)  Wt 152 lb (68.947 kg)  BMI 23.12 kg/m2  SpO2 100% Physical Exam  Nursing note and vitals reviewed. Constitutional: She is oriented to person, place, and time. She appears well-developed and well-nourished. No distress.  She is very uncomfortable from right hip pain. No acute cardiorespiratory distress. She is able to weight-bear, walks with a limp favoring right hip. She has strong odor of tobacco on her  HENT:  Head: Normocephalic and atraumatic.  Eyes: Conjunctivae  and EOM are normal.  Pupils are equal, round, and reactive to light. No scleral icterus.  Neck: Normal range of motion.  Cardiovascular: Normal rate.   Pulmonary/Chest: Effort normal.  Abdominal: She exhibits no distension.  Musculoskeletal:       Right hip: She exhibits decreased range of motion, tenderness, bony tenderness and deformity. She exhibits no laceration.  Old surgical scar/deformity with prominent bony tenderness right lateral hip .  Neurological: She is alert and oriented to person, place, and time.  Skin: Skin is warm.  Psychiatric: She has a normal mood and affect.   Neuro exam: Motor, sensory, DTRs intact. No focal deficit ED Course  Procedures (including critical care time) Labs Review Labs Reviewed - No data to display Imaging Review Dg Hip Complete Right  01/03/2013   CLINICAL DATA:  Pain  EXAM: RIGHT HIP - COMPLETE 2+ VIEW  COMPARISON:  Jul 15, 2009  FINDINGS: Frontal pelvis as well as frontal and lateral right hip images were obtained. No fracture or dislocation. Joint spaces appear intact. No erosive change.  IMPRESSION: No abnormality noted.   Electronically Signed   By: Bretta Bang M.D.   On: 01/03/2013 09:51    EKG Interpretation     Ventricular Rate:    PR Interval:    QRS Duration:   QT Interval:    QTC Calculation:   R Axis:     Text Interpretation:              MDM   1. Right hip pain    Reviewed with her that x-ray right hip shows no acute abnormalities. No fracture or dislocation. Joint space is intact. No erosive change. I am unsure of the cause of the right hip pain. Might be bursitis, or scar tissue associated with prior surgery years ago, or fibromyalgia. Also, the possibility of drug-seeking behavior, as she is requesting Vicodin today. I agreed to prescribe a small amount of Vicodin. #10, no refills.-- The patient stated that she was not satisfied with a small amount, but I advised her that here in urgent care, we cannot prescribe large amounts  of narcotic pain medication. I urged her to followup with Dr. Marlyne Beards, her orthopedist and/or Dr. Linford Arnold, her PCP within 3 days, who may consider referral to chronic pain management clinic/specialist. May use heat. OTC ibuprofen up to 600 mg every 8 hours with food when necessary pain. She declined muscle relaxant prescription. Precautions discussed. Red flags discussed. Questions invited and answered. Patient voiced understanding.   Lajean Manes, MD 01/03/13 2011

## 2013-01-05 ENCOUNTER — Other Ambulatory Visit: Payer: Self-pay | Admitting: Family Medicine

## 2013-01-07 ENCOUNTER — Other Ambulatory Visit: Payer: Self-pay | Admitting: Family Medicine

## 2013-01-17 ENCOUNTER — Ambulatory Visit: Payer: Medicare Other | Admitting: Physician Assistant

## 2013-01-21 ENCOUNTER — Ambulatory Visit (INDEPENDENT_AMBULATORY_CARE_PROVIDER_SITE_OTHER): Payer: Medicare Other | Admitting: Family Medicine

## 2013-01-21 ENCOUNTER — Encounter: Payer: Self-pay | Admitting: Family Medicine

## 2013-01-21 VITALS — BP 97/67 | HR 115 | Wt 153.0 lb

## 2013-01-21 DIAGNOSIS — M542 Cervicalgia: Secondary | ICD-10-CM

## 2013-01-21 DIAGNOSIS — M545 Low back pain: Secondary | ICD-10-CM

## 2013-01-21 MED ORDER — HYDROCODONE-ACETAMINOPHEN 5-325 MG PO TABS: 1.0000 | ORAL_TABLET | Freq: Two times a day (BID) | ORAL | Status: DC | PRN

## 2013-01-21 NOTE — Progress Notes (Signed)
  Subjective:    Patient ID: Gina Boyd, female    DOB: 30-Oct-1945, 67 y.o.   MRN: [id number]  HPI Both arms and shoulder and legs have been hurting for weeks and say sthe tramadol is not really helping.  Started 3 weeks ago.  Seen for right hip pain at [facility] and still bothers her on and off.  Pain radiating from between her shoulders to her neck.  Hx of DDD of the neck or spine. She really wants me to given her another chance on prescribing narcotics for pain controll she says the ultracet is really not very helpful.  No recent injury or trauma.   Review of Systems     Objective:   Physical Exam  Constitutional: She appears well-developed and well-nourished.  HENT:  Head: Normocephalic and atraumatic.  Musculoskeletal:  Neck with normal flexion. Normal extension but discomfort with extension. Fairly symmetric rotation right and left but discomfort in both directions. She is tender at the base of the cervical spine and over both trapezius muscles. Shoulders with decreased extension bilaterally but symmetric. Decreased internal rotation bilaterally as well. Crossover is normal. Hip, knee, ankle strength is 5 out of 5 bilaterally. Abduction the enlargement reflexes are 1+ bilaterally.          Assessment & Plan:  Chronic neck and back pain - Will given her one more change. Given 60 tabs of hydrocodone. Had long discussion with her that it is her responsibility to abied by the pain contracts. If she breaks the contract she will be dismissed from the practice immediately. F/u in 1 month. No refills until f/u appt i made.  Also recommended physical therapy. Her that even if this improves her pain a 15-20% that is significant. Also consider referral to or sports medicine doctor for further evaluation treatment for possible injections for pain relief.  I also discussed with her that she needs to let me know all medications that she takes even from outside physicians. She had not previous he  told me that she was on benzodiazepines with her psychiatrist. I explained her that this is to see name and it does not create trust between the 2 of [place] as far as prescribing her medications. Could also be detrimental to her health if I prescribe a medication that can potentially interact with another medication. In the future she needs to be completely forthcoming with all prescriptions and medications are prescribing any other provider including those in our office.

## 2013-02-10 ENCOUNTER — Telehealth: Payer: Self-pay | Admitting: Cardiology

## 2013-02-10 NOTE — Telephone Encounter (Signed)
Patient states she has been having recurrent CP over several weeks and this has also been a problem for her last year. She went to the  ED this past Saturday evening 02/08/13 with CP, SOB, dyspnea. She states that her BP was fine however they did not do an EKG. She states they "focused on my COPD and didn't pay attention to me telling them this was different". Patient requesting to be seen as soon as possible. Offered her the option to come in to the office at [address] [place]. In [place] to have BP check/EKG at any time. She states she does not have transportation to get to [place]. Offered her options to be seen at [facility], however she states she does not have transportation there either. She can only see Dr. Jens Som in his [place] office. First available appointment is for 02/19/13 in [place] office with Dr. Jens Som. Patient states that is "a long time to wait". Advised her that should she experience CP and/or other symptoms such as SOB, Headache, tingling, dyspnea, swelling then she should receive emergent care at the Emergency Room.  Advised her that when she is being seen she should specifically request an EKG, in light of her CP. Also advised that she should call her PCP and inform him/her what was going on and request to be seen by him/her as soon as possible to get an EKG/BP check. She stated she will do that and she will keep the 02/19/13 appointment with Dr. Jens Som. She stated she will consider going to ED if she needs to, although maybe a different one next time. Patient states she is concerned because the CP has been coming and going for the past year. She states she will definitely keep the appointment with Dr. Jens Som. Again offered her to come to the [facility] [address] office for a Nurse Room visit to get BP/EKG checked out if she can get a ride here before 02/19/13 appointment. She stated she will consider it.

## 2013-02-10 NOTE — Telephone Encounter (Signed)
New problem     Pt called to speak to some one about her CP .   Pt went to [facility] ER this week end but is still having CP  Please give her a call back.

## 2013-02-12 ENCOUNTER — Ambulatory Visit (INDEPENDENT_AMBULATORY_CARE_PROVIDER_SITE_OTHER): Payer: Medicare Other | Admitting: Physician Assistant

## 2013-02-12 ENCOUNTER — Encounter: Payer: Self-pay | Admitting: Physician Assistant

## 2013-02-12 VITALS — BP 124/81 | HR 99 | Wt 146.0 lb

## 2013-02-12 DIAGNOSIS — R0789 Other chest pain: Secondary | ICD-10-CM

## 2013-02-12 DIAGNOSIS — IMO0002 Reserved for concepts with insufficient information to code with codable children: Secondary | ICD-10-CM

## 2013-02-12 DIAGNOSIS — S29011A Strain of muscle and tendon of front wall of thorax, initial encounter: Secondary | ICD-10-CM

## 2013-02-12 DIAGNOSIS — R071 Chest pain on breathing: Secondary | ICD-10-CM

## 2013-02-12 DIAGNOSIS — R079 Chest pain, unspecified: Secondary | ICD-10-CM

## 2013-02-12 MED ORDER — MELOXICAM 15 MG PO TABS: 15.0000 mg | ORAL_TABLET | Freq: Every day | ORAL | Status: AC

## 2013-02-12 MED ORDER — KETOROLAC TROMETHAMINE 60 MG/2ML IM SOLN
60.0000 mg | Freq: Once | INTRAMUSCULAR | Status: AC
  Administered 2013-02-12: 60 mg via INTRAMUSCULAR

## 2013-02-12 MED ORDER — NITROGLYCERIN 0.3 MG SL SUBL: 0.3000 mg | SUBLINGUAL_TABLET | SUBLINGUAL | Status: DC | PRN

## 2013-02-12 NOTE — Patient Instructions (Addendum)
Fill the tramadol. Start mobic and stop ibuprofen. Ice chest wall.   Chest Wall Pain Chest wall pain is pain in or around the bones and muscles of your chest. It may take up to 6 weeks to get better. It may take longer if you must stay physically active in your work and activities.  CAUSES  Chest wall pain may happen on its own. However, it may be caused by:  A viral illness like the flu.  Injury.  Coughing.  Exercise.  Arthritis.  Fibromyalgia.  Shingles. HOME CARE INSTRUCTIONS   Avoid overtiring physical activity. Try not to strain or perform activities that cause pain. This includes any activities using your chest or your abdominal and side muscles, especially if heavy weights are used.  Put ice on the sore area.  Put ice in a plastic bag.  Place a towel between your skin and the bag.  Leave the ice on for 15-20 minutes per hour while awake for the first 2 days.  Only take over-the-counter or prescription medicines for pain, discomfort, or fever as directed by your caregiver. SEEK IMMEDIATE MEDICAL CARE IF:   Your pain increases, or you are very uncomfortable.  You have a fever.  Your chest pain becomes worse.  You have new, unexplained symptoms.  You have nausea or vomiting.  You feel sweaty or lightheaded.  You have a cough with phlegm (sputum), or you cough up blood. MAKE SURE YOU:   Understand these instructions.  Will watch your condition.  Will get help right away if you are not doing well or get worse. Document Released: 02/20/2005 Document Revised: 05/15/2011 Document Reviewed: 10/17/2010 [facility] Patient Information 2014 [place], [place].

## 2013-02-12 NOTE — Progress Notes (Addendum)
   Subjective:    Patient ID: Gina Boyd, female    DOB: 07-24-1945, 67 y.o.   MRN: [id number]  HPI Patient is a 67 yo female who presents to the clinic with CP that started 5 days ago on Saturday night. Pains continue to worsen so she went to ED.  Describes pain as sharp and all of a sudden. Pt will get a pain and it will radiate down her left arm for 5-10 minutes and then she would have another. Deep breaths make worse. Denies any injury. She admits to being very stressed. EKG, CXR, Cardaic enzymes, labs were all normal in ED. Pt was discharged with Tramadol. Pt admits not getting tramadol filled because it doesn't work. Pt requested vicodin today. She has also been taking her sisters nitroglyerin and reports it helps some. Denies any fever, cough, chills. Pain not worse after eating. Pt does have cardiology appt this month.     Review of Systems     Objective:   Physical Exam  Constitutional: She is oriented to person, place, and time. She appears well-developed and well-nourished.  HENT:  Head: Normocephalic and atraumatic.  Cardiovascular: Normal rate, regular rhythm and normal heart sounds.   Pulmonary/Chest: Effort normal and breath sounds normal. She has no wheezes.  Abdominal: Soft. Bowel sounds are normal. There is no tenderness. There is no guarding.  Musculoskeletal:  Tenderness over Left anterior chest wall to palpation.   Neurological: She is alert and oriented to person, place, and time.  Skin: Skin is warm and dry.  Psychiatric: She has a normal mood and affect. Her behavior is normal.          Assessment & Plan:  Chest pain/Chest wall sprain- PE revealed tenderness to palpation over left chest wall. I feel like pt might have sprained the chest wall. Gave handout. Discussed NSAIDs. Gave mobic to try daily for next 5 days. Discussed cold compresses. i refused to refill any narcotics. Pt can get tramadol filled if she needs any pain relief. Keep cardiology appt  this month.Nitroglycerin was given for CP if needed. Can repeat aft 5 minutes. Discussed with pt if not helping do not continue to take.

## 2013-02-14 ENCOUNTER — Ambulatory Visit (INDEPENDENT_AMBULATORY_CARE_PROVIDER_SITE_OTHER): Payer: Medicare Other | Admitting: Family Medicine

## 2013-02-14 ENCOUNTER — Encounter: Payer: Self-pay | Admitting: Family Medicine

## 2013-02-14 ENCOUNTER — Other Ambulatory Visit: Payer: Self-pay | Admitting: Family Medicine

## 2013-02-14 VITALS — BP 127/82 | HR 125 | Temp 97.4°F | Wt 157.0 lb

## 2013-02-14 DIAGNOSIS — N390 Urinary tract infection, site not specified: Secondary | ICD-10-CM

## 2013-02-14 DIAGNOSIS — R82998 Other abnormal findings in urine: Secondary | ICD-10-CM

## 2013-02-14 DIAGNOSIS — R109 Unspecified abdominal pain: Secondary | ICD-10-CM

## 2013-02-14 LAB — POCT URINALYSIS DIPSTICK
Leukocytes, UA: NEGATIVE
Nitrite, UA: POSITIVE
Protein, UA: 30
Spec Grav, UA: 1.015
Urobilinogen, UA: 1
pH, UA: 5.5

## 2013-02-14 MED ORDER — CIPROFLOXACIN HCL 500 MG PO TABS: 500.0000 mg | ORAL_TABLET | Freq: Two times a day (BID) | ORAL | Status: AC

## 2013-02-14 MED ORDER — PHENAZOPYRIDINE HCL 200 MG PO TABS
200.0000 mg | ORAL_TABLET | Freq: Three times a day (TID) | ORAL | Status: DC | PRN
Start: 2013-02-14 — End: 2013-03-21

## 2013-02-14 MED ORDER — CEFTRIAXONE SODIUM 1 G IJ SOLR
1.0000 g | Freq: Once | INTRAMUSCULAR | Status: AC
  Administered 2013-02-14: 1 g via INTRAMUSCULAR

## 2013-02-14 NOTE — Progress Notes (Signed)
   Subjective:    Patient ID: Gina Boyd, female    DOB: May 27, 1945, 67 y.o.   MRN: [id number]  HPI Dysuria x4 days. Has been using Uricalm. No hematuria. She says the pelvic pain and frequent urination kept her up most of the night last night.. She's afebrile her today. No blood in the urine. She's felt like she's had some low-grade temps around 99.8.   Review of Systems     Objective:   Physical Exam  Constitutional: She is oriented to person, place, and time. She appears well-developed and well-nourished.  HENT:  Head: Normocephalic and atraumatic.  Abdominal: Soft. Bowel sounds are normal. She exhibits distension. She exhibits no mass. There is tenderness. There is no rebound and no guarding.  She is bloated. Increased tympany. Suprapubic tenderness.  Neurological: She is alert and oriented to person, place, and time.  Skin: Skin is warm and dry.  Psychiatric: She has a normal mood and affect. Her behavior is normal.          Assessment & Plan:  UTI-urinalysis is positive for nitrates. Will prescribe Pyridium for pain control. We'll put her on Cipro twice a day for 5 days. She did request an injection to get things going in the right direction so we did give her Rocephin 1 g IM. Hopefully this will get her feeling better more quickly. Make sure to increase fluids. Handout provided. Call if not significantly better after the weekend.  Okay to call for refill prescription on her hydrocodone next week on 01/20/13

## 2013-02-14 NOTE — Patient Instructions (Signed)
Urinary Tract Infection  Urinary tract infections (UTIs) can develop anywhere along your urinary tract. Your urinary tract is your body's drainage system for removing wastes and extra water. Your urinary tract includes two kidneys, two ureters, a bladder, and a urethra. Your kidneys are a pair of bean-shaped organs. Each kidney is about the size of your fist. They are located below your ribs, one on each side of your spine.  CAUSES  Infections are caused by microbes, which are microscopic organisms, including fungi, viruses, and bacteria. These organisms are so small that they can only be seen through a microscope. Bacteria are the microbes that most commonly cause UTIs.  SYMPTOMS   Symptoms of UTIs may vary by age and gender of the patient and by the location of the infection. Symptoms in young women typically include a frequent and intense urge to urinate and a painful, burning feeling in the bladder or urethra during urination. Older women and men are more likely to be tired, shaky, and weak and have muscle aches and abdominal pain. A fever may mean the infection is in your kidneys. Other symptoms of a kidney infection include pain in your back or sides below the ribs, nausea, and vomiting.  DIAGNOSIS  To diagnose a UTI, your caregiver will ask you about your symptoms. Your caregiver also will ask to provide a urine sample. The urine sample will be tested for bacteria and white blood cells. White blood cells are made by your body to help fight infection.  TREATMENT   Typically, UTIs can be treated with medication. Because most UTIs are caused by a bacterial infection, they usually can be treated with the use of antibiotics. The choice of antibiotic and length of treatment depend on your symptoms and the type of bacteria causing your infection.  HOME CARE INSTRUCTIONS   If you were prescribed antibiotics, take them exactly as your caregiver instructs you. Finish the medication even if you feel better after you  have only taken some of the medication.   Drink enough water and fluids to keep your urine clear or pale yellow.   Avoid caffeine, tea, and carbonated beverages. They tend to irritate your bladder.   Empty your bladder often. Avoid holding urine for long periods of time.   Empty your bladder before and after sexual intercourse.   After a bowel movement, women should cleanse from front to back. Use each tissue only once.  SEEK MEDICAL CARE IF:    You have back pain.   You develop a fever.   Your symptoms do not begin to resolve within 3 days.  SEEK IMMEDIATE MEDICAL CARE IF:    You have severe back pain or lower abdominal pain.   You develop chills.   You have nausea or vomiting.   You have continued burning or discomfort with urination.  MAKE SURE YOU:    Understand these instructions.   Will watch your condition.   Will get help right away if you are not doing well or get worse.  Document Released: 11/30/2004 Document Revised: 08/22/2011 Document Reviewed: 03/31/2011  [employer] Patient Information 2014 [employer]

## 2013-02-16 LAB — URINE CULTURE: Colony Count: NO GROWTH

## 2013-02-18 ENCOUNTER — Ambulatory Visit: Payer: Medicare Other | Admitting: Family Medicine

## 2013-02-19 ENCOUNTER — Encounter: Payer: Self-pay | Admitting: Cardiology

## 2013-02-19 ENCOUNTER — Telehealth: Payer: Self-pay

## 2013-02-19 ENCOUNTER — Encounter: Payer: Self-pay | Admitting: *Deleted

## 2013-02-19 ENCOUNTER — Other Ambulatory Visit: Payer: Self-pay | Admitting: *Deleted

## 2013-02-19 ENCOUNTER — Ambulatory Visit (INDEPENDENT_AMBULATORY_CARE_PROVIDER_SITE_OTHER): Payer: Medicare Other | Admitting: Cardiology

## 2013-02-19 VITALS — BP 120/76 | HR 92 | Wt 150.0 lb

## 2013-02-19 DIAGNOSIS — Z720 Tobacco use: Secondary | ICD-10-CM

## 2013-02-19 DIAGNOSIS — R9389 Abnormal findings on diagnostic imaging of other specified body structures: Secondary | ICD-10-CM

## 2013-02-19 DIAGNOSIS — F17200 Nicotine dependence, unspecified, uncomplicated: Secondary | ICD-10-CM

## 2013-02-19 DIAGNOSIS — R079 Chest pain, unspecified: Secondary | ICD-10-CM

## 2013-02-19 MED ORDER — HYDROCODONE-ACETAMINOPHEN 5-325 MG PO TABS: 1.0000 | ORAL_TABLET | Freq: Two times a day (BID) | ORAL | Status: DC | PRN

## 2013-02-19 NOTE — Assessment & Plan Note (Signed)
Patient counseled on discontinuing. 

## 2013-02-19 NOTE — Progress Notes (Signed)
HPI: FU palpitations. I saw him in August of 2013 is scheduled a monitor and nuclear study. The patient canceled these. CTA in September of 2014 showed no pulmonary embolus, coronary calcification and pulmonary nodule. Followup recommended. Since last seen, patient states that she has had chest pain for the past one month. It has been continuous for the past 2 weeks. It increases with smoking, inspiration and certain movements. She has had some nausea but no dyspnea or diaphoresis. She has taken nitroglycerin and aspirin with no relief. The pain radiates to her left upper extremity. She denies dyspnea.   Current Outpatient Prescriptions  Medication Sig Dispense Refill  . albuterol (PROVENTIL HFA;VENTOLIN HFA) 108 (90 BASE) MCG/ACT inhaler Inhale 2 puffs into the lungs every 4 (four) hours as needed for wheezing.  1 Inhaler  2  . AMBULATORY NON FORMULARY MEDICATION Medication Name: glucometer with strips and lancets to test once a day. Dx.250.00  1 vial  0  . AMBULATORY NON FORMULARY MEDICATION Medication Name: shingles vaccine IM x 1  1 vial  0  . clonazePAM (KLONOPIN) 0.5 MG tablet Take 0.5 mg by mouth 2 (two) times daily as needed for anxiety (Rx by psych).      . cyclobenzaprine (FLEXERIL) 10 MG tablet take 1 tablet by mouth every 8 hours if needed for muscle spasm      . Fluticasone-Salmeterol (ADVAIR DISKUS) 250-50 MCG/DOSE AEPB Inhale 1 puff into the lungs every 12 (twelve) hours.      Marland Kitchen glipiZIDE (GLUCOTROL) 10 MG tablet take 1 tablet by mouth twice a day BEFORE A MEAL  60 tablet  2  . HYDROcodone-acetaminophen (NORCO/VICODIN) 5-325 MG per tablet Take 1 tablet by mouth 2 (two) times daily as needed for moderate pain. Take with food.  60 tablet  0  . meloxicam (MOBIC) 15 MG tablet Take 1 tablet (15 mg total) by mouth daily.  30 tablet  0  . nitroGLYCERIN (NITROSTAT) 0.3 MG SL tablet Place 1 tablet (0.3 mg total) under the tongue every 5 (five) minutes as needed for chest pain.  90  tablet  0  . phenazopyridine (PYRIDIUM) 200 MG tablet Take 1 tablet (200 mg total) by mouth 3 (three) times daily as needed for pain.  30 tablet  0  . pravastatin (PRAVACHOL) 40 MG tablet take 1 tablet by mouth at bedtime  30 tablet  3  . pregabalin (LYRICA) 200 MG capsule take 1 capsule by mouth three times a day  90 capsule  0  . QUEtiapine (SEROQUEL) 100 MG tablet Take 100 mg by mouth at bedtime.      . topiramate (TOPAMAX) 200 MG tablet Take 200 mg by mouth daily.       Marland Kitchen zolpidem (AMBIEN) 10 MG tablet Take 10 mg by mouth at bedtime as needed for sleep (rx by psych).      . chlorproMAZINE (THORAZINE) 25 MG tablet 25 mg.       No current facility-administered medications for this visit.     Past Medical History  Diagnosis Date  . Hypercholesteremia   . Lumbar vertebral fracture   . Chest pain   . Palpitations   . History of shingles 2013    right face    Past Surgical History  Procedure Laterality Date  . Cholecystectomy    . Abdominal hysterectomy      for bleeding, Complete hys  . Appendectomy    . 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 Marital Status: Divorced    Spouse Name: N/A    Number of Children: 3  . Years of Education: N/A   Occupational History  . Retired.      Retired   Social History Main Topics  . Smoking status: Current Every Day Smoker -- 1.00 packs/day for 30 years    Types: Cigarettes  . Smokeless tobacco: Never Used  . Alcohol Use: No  . Drug Use: No  . Sexual Activity: No   Other Topics Concern  . Not on file   Social History Narrative   No regular exercise. 2 caffeine drinks per day. She's not sexually active. Currently lives with her sister Lavonna Monarch and brother-in-law.    ROS: lower abdominal pain but no fevers or chills, productive cough, hemoptysis, dysphasia, odynophagia, melena, hematochezia, dysuria, hematuria, rash, seizure activity, orthopnea, PND, pedal edema, claudication. Remaining systems are  negative.  Physical Exam: Well-developed well-nourished in no acute distress.  Skin is warm and dry.  HEENT is normal.  Neck is supple.  Chest is clear to auscultation with normal expansion. Chest pain reproduced with palpation. Cardiovascular exam is regular rate and rhythm. Distant heart sounds  Abdominal exam nontender or distended. No masses palpated. Extremities show no edema. neuro grossly intact  ECG sinus rhythm with nonspecific ST changes.

## 2013-02-19 NOTE — Patient Instructions (Signed)
Your physician recommends that you schedule a follow-up appointment in:  AS NEEDED PENDING TEST RESULTS  Your physician has requested that you have a lexiscan myoview. For further information please visit [email or web address]. Please follow instruction sheet, as given.   

## 2013-02-19 NOTE — Telephone Encounter (Signed)
She needs a refill on Hydrocodone.

## 2013-02-19 NOTE — Assessment & Plan Note (Signed)
Symptoms are extremely atypical. I think the most likely musculoskeletal. However she does have risk factors and calcium noted in her coronaries on CT scan. I will proceed with a nuclear study for risk stratification.

## 2013-02-19 NOTE — Telephone Encounter (Signed)
rx printed

## 2013-02-19 NOTE — Assessment & Plan Note (Signed)
Patient states she followed up with a pulmonologist in [facility] concerning this issue.

## 2013-03-05 ENCOUNTER — Telehealth: Payer: Self-pay | Admitting: *Deleted

## 2013-03-05 NOTE — Telephone Encounter (Signed)
I spoke with Gina Boyd, and she stated that she will call us when she is ready to schedule  her stress test.

## 2013-03-09 ENCOUNTER — Encounter: Payer: Self-pay | Admitting: Emergency Medicine

## 2013-03-09 ENCOUNTER — Emergency Department (INDEPENDENT_AMBULATORY_CARE_PROVIDER_SITE_OTHER)
Admission: EM | Admit: 2013-03-09 | Discharge: 2013-03-09 | Disposition: A | Discharge status: Home / Self Care | Payer: Medicare Other | Source: Home / Self Care | Attending: Family Medicine | Admitting: Family Medicine

## 2013-03-09 DIAGNOSIS — J209 Acute bronchitis, unspecified: Secondary | ICD-10-CM

## 2013-03-09 DIAGNOSIS — J441 Chronic obstructive pulmonary disease with (acute) exacerbation: Secondary | ICD-10-CM

## 2013-03-09 DIAGNOSIS — R51 Headache: Secondary | ICD-10-CM

## 2013-03-09 MED ORDER — ALBUTEROL SULFATE HFA 108 (90 BASE) MCG/ACT IN AERS: 2.0000 | INHALATION_SPRAY | RESPIRATORY_TRACT | Status: AC | PRN

## 2013-03-09 MED ORDER — KETOROLAC TROMETHAMINE 30 MG/ML IJ SOLN
30.0000 mg | Freq: Once | INTRAMUSCULAR | Status: AC
  Administered 2013-03-09: 30 mg via INTRAMUSCULAR

## 2013-03-09 MED ORDER — LEVOFLOXACIN 500 MG PO TABS: 500.0000 mg | ORAL_TABLET | Freq: Every day | ORAL | Status: DC

## 2013-03-09 MED ORDER — CEFTRIAXONE SODIUM 500 MG IJ SOLR
500.0000 mg | Freq: Once | INTRAMUSCULAR | Status: AC
  Administered 2013-03-09: 500 mg via INTRAMUSCULAR

## 2013-03-09 MED ORDER — BENZONATATE 200 MG PO CAPS: 200.0000 mg | ORAL_CAPSULE | Freq: Every day | ORAL | Status: DC
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
## 2013-03-09 NOTE — Discharge Instructions (Signed)
Take plain Mucinex (1200 mg guaifenesin) twice daily for cough and congestion.  May add Sudafed for sinus congestion.   Increase fluid intake, rest. °May use Afrin nasal spray (or generic oxymetazoline) twice daily for about 5 days.  Also recommend using saline nasal spray several times daily and saline nasal irrigation (AYR is a common brand) °Try warm salt water gargles for sore throat.  °Stop all antihistamines for now, and other non-prescription cough/cold preparations. °May take Ibuprofen 200mg, 4 tabs every 8 hours with food for chest/sternum discomfort. °  °Follow-up with family doctor if not improving 7 to 10 days.  °

## 2013-03-09 NOTE — ED Notes (Signed)
Patient c/o headache since last night. Cough, congestion x 2days.

## 2013-03-14 ENCOUNTER — Ambulatory Visit: Payer: Medicare Other | Admitting: Physician Assistant

## 2013-03-18 ENCOUNTER — Encounter: Payer: Self-pay | Admitting: Physician Assistant

## 2013-03-18 ENCOUNTER — Ambulatory Visit (INDEPENDENT_AMBULATORY_CARE_PROVIDER_SITE_OTHER): Payer: Medicare Other | Admitting: Physician Assistant

## 2013-03-18 VITALS — BP 113/73 | HR 101 | Wt 147.0 lb

## 2013-03-18 DIAGNOSIS — J441 Chronic obstructive pulmonary disease with (acute) exacerbation: Secondary | ICD-10-CM

## 2013-03-18 MED ORDER — FLUTICASONE-SALMETEROL 250-50 MCG/DOSE IN AEPB: 1.0000 | INHALATION_SPRAY | Freq: Two times a day (BID) | RESPIRATORY_TRACT | Status: DC

## 2013-03-18 MED ORDER — BENZONATATE 200 MG PO CAPS: 200.0000 mg | ORAL_CAPSULE | Freq: Every day | ORAL | Status: DC

## 2013-03-18 MED ORDER — METHYLPREDNISOLONE ACETATE 40 MG/ML IJ SUSP
40.0000 mg | Freq: Once | INTRAMUSCULAR | Status: AC
  Administered 2013-03-18: 40 mg via INTRAMUSCULAR

## 2013-03-18 NOTE — Patient Instructions (Signed)
Follow up in 1- 2 weeks to recheck breathing. May consider adding spiriva if not improving. Continue on advair twice a day.

## 2013-03-19 ENCOUNTER — Other Ambulatory Visit: Payer: Self-pay | Admitting: Family Medicine

## 2013-03-19 ENCOUNTER — Ambulatory Visit: Payer: Medicare Other | Admitting: Cardiology

## 2013-03-19 ENCOUNTER — Telehealth: Payer: Self-pay | Admitting: *Deleted

## 2013-03-19 MED ORDER — BUDESONIDE-FORMOTEROL FUMARATE 160-4.5 MCG/ACT IN AERO: 2.0000 | INHALATION_SPRAY | Freq: Two times a day (BID) | RESPIRATORY_TRACT | Status: DC

## 2013-03-19 NOTE — Telephone Encounter (Signed)
Pt left message stating that she was unable to get the advair from the pharm & wants to know if you could send her something comparable in.

## 2013-03-19 NOTE — Telephone Encounter (Signed)
Ok but you still need to be using the sample I gave you twice a day. Please send symbicort 160/4.705mcg 2 puffs BID with 5 refills. Please let me know if not able to pick up.

## 2013-03-19 NOTE — Telephone Encounter (Signed)
Pt notified rx sent.

## 2013-03-19 NOTE — Progress Notes (Signed)
   Subjective:    Patient ID: Gina Boyd, female    DOB: [birth year], 68 y.o.   MRN: [id number]  HPI Pt is a 68 yo female with COPD and every day tobacco use comes in for hospital follow up for wheezing, cough, SOB. She was seen in UC on 03/09/13 for acute bronchitis given levaquin 500mg , rocephin 500mg , toradol, and tessalon perles. Tessalon does help. She did not feel significantly better so went to ED on 03/14/13. She was given prednisone pack, levaquin 750mg  and sent home. CXR was negative for pneumonia. She is using albuterol inhaler 4 times a day. During conversation she has been out of her advair for over a month and not using it. She continues to cough all the time but sputum is clear.    Review of Systems     Objective:   Physical Exam  Constitutional: She appears well-developed and well-nourished.  HENT:  Head: Normocephalic and atraumatic.  Right Ear: External ear normal.  Left Ear: External ear normal.  Nose: Nose normal.  Mouth/Throat: Oropharynx is clear and moist.  Eyes: Conjunctivae are normal.  Neck: Normal range of motion. Neck supple.  Cardiovascular: Regular rhythm and normal heart sounds.   Tachycardia at 101.   Pulmonary/Chest:  Bilateral lungs: inspiratory and expiratory wheezing with rhonchi. cough with maneuvers.   Lymphadenopathy:    She has no cervical adenopathy.  Psychiatric: She has a normal mood and affect. Her behavior is normal.          Assessment & Plan:  Copd exacerbation- pulse ox is 94 percent which is encouraging. I believe recent exacerbations has been because pt was not taking advair. Gave samples today and sent to pharmacy. Discussed importance for twice a day use. I do not think pt needs another abx at this time. She has had extended doses of levaquin and rocephin and a negative CXR. Continue to use albuterol as needed but should be able to back off in the next couple of days. Pulse ox is too good for home O2. I suggested to finish 1-2  days left of prednisone pack for hospital. Pt said it made her heart race so she is going to stop. I will give depo medrol 40mg  IM in office today. Follow up in 1-2 weeks with PCP to recheck lungs and restart of advair. May need to consider spiriva in addition if not quite getting to controlled status. Per pt never tried.   Spent 30 minutes with patient and greater than 50 percent of visit spent counseling pt regarding COPD and daily inhalers.

## 2013-03-21 ENCOUNTER — Ambulatory Visit (INDEPENDENT_AMBULATORY_CARE_PROVIDER_SITE_OTHER): Payer: Medicare Other | Admitting: Family Medicine

## 2013-03-21 ENCOUNTER — Encounter: Payer: Self-pay | Admitting: Family Medicine

## 2013-03-21 VITALS — BP 107/67 | HR 99 | Temp 97.5°F | Ht 68.0 in | Wt 149.0 lb

## 2013-03-21 DIAGNOSIS — M479 Spondylosis, unspecified: Secondary | ICD-10-CM

## 2013-03-21 DIAGNOSIS — J441 Chronic obstructive pulmonary disease with (acute) exacerbation: Secondary | ICD-10-CM

## 2013-03-21 DIAGNOSIS — G8929 Other chronic pain: Secondary | ICD-10-CM

## 2013-03-21 DIAGNOSIS — M542 Cervicalgia: Secondary | ICD-10-CM

## 2013-03-21 DIAGNOSIS — F17200 Nicotine dependence, unspecified, uncomplicated: Secondary | ICD-10-CM

## 2013-03-21 DIAGNOSIS — J209 Acute bronchitis, unspecified: Secondary | ICD-10-CM

## 2013-03-21 DIAGNOSIS — Z720 Tobacco use: Secondary | ICD-10-CM

## 2013-03-21 MED ORDER — METHYLPREDNISOLONE ACETATE 40 MG/ML IJ SUSP
40.0000 mg | Freq: Once | INTRAMUSCULAR | Status: AC
  Administered 2013-03-21: 40 mg via INTRAMUSCULAR

## 2013-03-21 MED ORDER — HYDROCODONE-ACETAMINOPHEN 5-325 MG PO TABS: 1.0000 | ORAL_TABLET | Freq: Two times a day (BID) | ORAL | Status: DC | PRN

## 2013-03-21 NOTE — Addendum Note (Signed)
Addended by: Deno EtienneBARKLEY, Candler Ginsberg L on: 03/21/2013 01:59 PM   Modules accepted: Orders

## 2013-03-21 NOTE — Progress Notes (Signed)
   Subjective:    Patient ID: Gina Boyd, female    DOB: 1945/05/03, 68 y.o.   MRN: [id number]  HPI F/u on bronchitis - she is feeling some better. Says the steroid shot really help. Sputum  Is better. No fever or chills or sweats. Now on symbicort as Advair was not covered on her insurance. Pulse ox is up to 96%.  She is working on smoking cessation.  She feels like one more steorid shot will help her get completely over this.  She had previously been out of her Advair for several months which may have explained her repeat exacerbations.  Chronic back and neck pain-she would like to go up on her pain regimen to 3 tabs per day instead of 2. She feels that that would give her better control overall. She does have a history of drug-seeking.   Review of Systems     Objective:   Physical Exam  Constitutional: She is oriented to person, place, and time. She appears well-developed and well-nourished.  HENT:  Head: Normocephalic and atraumatic.  Cardiovascular: Normal rate, regular rhythm and normal heart sounds.   Pulmonary/Chest: Effort normal.  Diffuse rhonchi at the end of inspiration. No wheezing today.  Neurological: She is alert and oriented to person, place, and time.  Skin: Skin is warm and dry.  Psychiatric: She has a normal mood and affect. Her behavior is normal.          Assessment & Plan:  Bronchitis-will give another Depo-Medrol 40 mg IM shot today. I do think she significantly improved and her pulse ox is back up to her baseline today which is fantastic. She still has some rhonchi on exam especially at the end of inspiration but no wheezing. Still some sputum production but again, back to her baseline.  Tobacco abuse-encourage cessation. She says she is still working on trying to wean down.  Chronic back and neck pain-discussed with her that right now we will keep her regimen where it is. I really want her to prove to me over the next few months that she can be  trusted and does not call early for prescriptions especially with her prior history of drug-seeking behavior. I do feel that she has real pain which seems to be treated but I want to be very careful about what we are getting her period she says she understands. Followup in one month.

## 2013-03-24 ENCOUNTER — Telehealth: Payer: Self-pay | Admitting: *Deleted

## 2013-03-24 DIAGNOSIS — R0902 Hypoxemia: Secondary | ICD-10-CM

## 2013-03-24 NOTE — Telephone Encounter (Signed)
We can order overnight pulse ox through Home Health to see if dropping at night but agree doesn't qualify for daytime oxygen.

## 2013-03-24 NOTE — Telephone Encounter (Signed)
Pt called & left vm asking about at home o2 for her.  I called her back to let her know that ins will not cover home o2 unless her sats are 88% or under & every time we've checked her here she's be 95-96%.  She said she checks it at home & it drops low but never to 88 or lower.  She states that she's been using her sister's o2 some.  She also states that she wakes up in the middle of the night unable to catch a good breath.  I reiterated that her ins would not cover home o2. Just FYI

## 2013-03-25 MED ORDER — AMBULATORY NON FORMULARY MEDICATION: Status: DC

## 2013-03-25 NOTE — Telephone Encounter (Signed)
It could be thrush. She's make an appointment so we can take a look at the back of her throat and even swab her if needed. She's also make sure that she's rinsing after each inhaler use.

## 2013-03-25 NOTE — Telephone Encounter (Signed)
Prescription printed. Please ask the home health.

## 2013-03-25 NOTE — Telephone Encounter (Signed)
Spoke with pt & notified her of the option of overnight o2.  She told me that she wasn't going to be able to do any of the inhalers that she has because they are "making her worse". She states that she is coughing constantly & her throat hurts & "something just isn't right".  She also states that she has tiny little bumps on the inside of her mouth.  Could this be thrush from abx? Please advise.

## 2013-03-25 NOTE — Telephone Encounter (Signed)
[facility]  Meyer CoryMisty Ahmad, LPN

## 2013-03-25 NOTE — Telephone Encounter (Signed)
Spoke with pt & she said that she wants to give it one more day & see how she feels.  And she would like to do the overnight pulse ox.

## 2013-03-26 ENCOUNTER — Ambulatory Visit: Payer: Medicare Other | Admitting: Physician Assistant

## 2013-03-27 ENCOUNTER — Telehealth: Payer: Self-pay

## 2013-03-27 ENCOUNTER — Other Ambulatory Visit: Payer: Self-pay | Admitting: Physician Assistant

## 2013-03-27 MED ORDER — FIRST-DUKES MOUTHWASH MT SUSP: OROMUCOSAL | Status: AC

## 2013-03-27 MED ORDER — PROMETHAZINE HCL 25 MG PO TABS: 25.0000 mg | ORAL_TABLET | Freq: Three times a day (TID) | ORAL | Status: DC | PRN

## 2013-03-27 MED ORDER — BENZONATATE 200 MG PO CAPS: 200.0000 mg | ORAL_CAPSULE | Freq: Every day | ORAL | Status: DC

## 2013-03-27 NOTE — Telephone Encounter (Signed)
No codeine. Ok for phenergan 25mg  numbner 20. No refills.

## 2013-03-27 NOTE — Telephone Encounter (Signed)
Patient advised and prescription sent.  

## 2013-03-27 NOTE — Telephone Encounter (Signed)
Prescription sent

## 2013-03-27 NOTE — Telephone Encounter (Signed)
Disregard the last message. She states her insurance will not pay for the Tessalon capsules. She would like phenergan with CDN.

## 2013-03-27 NOTE — Telephone Encounter (Signed)
Ok for IKON Office Solutionstessalon number 20 no refills.

## 2013-03-27 NOTE — Telephone Encounter (Signed)
Sent prescription

## 2013-03-27 NOTE — Telephone Encounter (Signed)
OK to send for magic mouthwash

## 2013-03-27 NOTE — Telephone Encounter (Signed)
Patient states she is not going to take the phenergan. She lost the benzonatate (TESSALON) 200 MG capsule in her house and would like a refill. Please advise.

## 2013-03-27 NOTE — Telephone Encounter (Signed)
Gina GranaStephana states she lost the benzonatate (TESSALON) 200 MG capsule in her house. Can she have another prescription.

## 2013-03-27 NOTE — Telephone Encounter (Signed)
Patient states she has a sore mouth. Denies lesions, fever, chills or sweats. She would like magic mouth wash.

## 2013-03-27 NOTE — Telephone Encounter (Signed)
Confirm pt still needs tessalon pearls?

## 2013-03-31 ENCOUNTER — Encounter: Payer: Self-pay | Admitting: Physician Assistant

## 2013-03-31 ENCOUNTER — Ambulatory Visit (INDEPENDENT_AMBULATORY_CARE_PROVIDER_SITE_OTHER): Payer: Medicare Other | Admitting: Physician Assistant

## 2013-03-31 VITALS — BP 109/72 | HR 101 | Wt 146.0 lb

## 2013-03-31 DIAGNOSIS — J441 Chronic obstructive pulmonary disease with (acute) exacerbation: Secondary | ICD-10-CM

## 2013-03-31 DIAGNOSIS — G4700 Insomnia, unspecified: Secondary | ICD-10-CM

## 2013-03-31 DIAGNOSIS — J209 Acute bronchitis, unspecified: Secondary | ICD-10-CM

## 2013-03-31 MED ORDER — FLUTICASONE-SALMETEROL 250-50 MCG/DOSE IN AEPB: 1.0000 | INHALATION_SPRAY | Freq: Two times a day (BID) | RESPIRATORY_TRACT | Status: DC

## 2013-03-31 MED ORDER — ACLIDINIUM BROMIDE 400 MCG/ACT IN AEPB: INHALATION_SPRAY | RESPIRATORY_TRACT | Status: AC

## 2013-04-02 ENCOUNTER — Telehealth: Payer: Self-pay | Admitting: *Deleted

## 2013-04-02 ENCOUNTER — Other Ambulatory Visit: Payer: Self-pay | Admitting: Physician Assistant

## 2013-04-02 MED ORDER — BENZONATATE 200 MG PO CAPS: 200.0000 mg | ORAL_CAPSULE | Freq: Every day | ORAL | Status: DC

## 2013-04-02 MED ORDER — FLUTICASONE-SALMETEROL 250-50 MCG/DOSE IN AEPB: 1.0000 | INHALATION_SPRAY | Freq: Two times a day (BID) | RESPIRATORY_TRACT | Status: AC

## 2013-04-02 NOTE — Telephone Encounter (Signed)
Pt calls and states she needs a refill on the Tessalon perles. Got #20 on 1/22.  Also states that the inhaler you gave her as sample is not working for. Also on Advair and states this is working but the sample is not. Barry DienesKimberly Gordon, LPN

## 2013-04-02 NOTE — Progress Notes (Signed)
   Subjective:    Patient ID: Gina Boyd, female    DOB: Feb 26, 1946, 68 y.o.   MRN: [id number]  HPI Pt presents to the clinic with CC: chest congestion and cough. Pt had not been taking advair and recently put back on. She has improved significantly since adding this therapy; however, she still is SOB and coughing. She is using tessalon perles and helping. Denies any fever, chills, ST, ear pain, sinus pressure. She is being evaluated for home O2 at night this week. Cough is productive with clear sputum. Would like another steroid shot today thinks it will help get her completely better.     Pt also wants refill on Ambien. States she lost rx or threw it away. She cannot sleep without it.    Review of Systems     Objective:   Physical Exam  Constitutional: She is oriented to person, place, and time. She appears well-developed and well-nourished.  HENT:  Head: Normocephalic and atraumatic.  Right Ear: External ear normal.  Left Ear: External ear normal.  Nose: Nose normal.  Mouth/Throat: Oropharynx is clear and moist. No oropharyngeal exudate.  Eyes: Conjunctivae are normal. Right eye exhibits no discharge. Left eye exhibits no discharge.  Neck: Normal range of motion. Neck supple.  Cardiovascular: Normal rate, regular rhythm and normal heart sounds.   Pulmonary/Chest:  Few rhonchi at bases. No wheezing.   Lymphadenopathy:    She has no cervical adenopathy.  Neurological: She is alert and oriented to person, place, and time.  Skin: Skin is dry.  Psychiatric: She has a normal mood and affect. Her behavior is normal.          Assessment & Plan:  COPD- pt's pulse ox is 96% today and stable. Her lung exam had no wheezing and only a few rhonchi. I feel that COPD is not controlled at this point but the acute bronchitis is improving. I don't feel like pt needs another steroid shot today. I did give sample of tudorza to start twice a day. Showed how to use inhaler in office.  Instructed pt to use in combination with advair. Pt aware she needs to quit smoking to help lung function. Follow up as needed. Or with PCP in 4-6 weeks.   Insomnia- previously prescribed by psychiatrist. Pt has a hx of drug seeking behavior and loosing rx. Discussed with Dr. Linford ArnoldMetheney and does not feel comfortable writing rx. Discussed OTC options for sleep such as melatonin and benadryl.

## 2013-04-02 NOTE — Telephone Encounter (Signed)
Pt notified meds sent and results of overnight study. Barry DienesKimberly Gordon, LPN

## 2013-04-02 NOTE — Telephone Encounter (Signed)
Discussed with Dr. Linford Arnoldmetheney and she also agrees since insurance will not pay and pt did not like we can try spirivia and advair together to see if there is benefit? Would she like for us to send?

## 2013-04-02 NOTE — Telephone Encounter (Signed)
Okay to refill Tessalon pearls. Okay to go back to Advair. I think she had given her tudorza which is not covered on her insurance anyway. Please also let her that it is the results of her overnight pulse oximetry. Based on that she actually oxygenating fairly well while she was asleep. She does not need oxygen overnight. She is welcome to come in and do a 6 minute pulse ox walk test to see if she qualifies for daytime continuous oxygen.

## 2013-04-03 NOTE — Telephone Encounter (Signed)
Gina Boyd can you call this patient please. Gina DienesKimberly Gordon, LPN

## 2013-04-03 NOTE — Telephone Encounter (Signed)
Spoke with pt & she is ok with trying spiriva & advair together.

## 2013-04-04 ENCOUNTER — Other Ambulatory Visit: Payer: Self-pay | Admitting: Physician Assistant

## 2013-04-04 MED ORDER — TIOTROPIUM BROMIDE MONOHYDRATE 18 MCG IN CAPS: 18.0000 ug | ORAL_CAPSULE | Freq: Every day | RESPIRATORY_TRACT | Status: AC

## 2013-04-07 ENCOUNTER — Telehealth: Payer: Self-pay | Admitting: Family Medicine

## 2013-04-07 ENCOUNTER — Encounter: Payer: Self-pay | Admitting: Family Medicine

## 2013-04-07 ENCOUNTER — Telehealth: Payer: Self-pay | Admitting: *Deleted

## 2013-04-07 ENCOUNTER — Ambulatory Visit (INDEPENDENT_AMBULATORY_CARE_PROVIDER_SITE_OTHER): Payer: Medicare Other | Admitting: Family Medicine

## 2013-04-07 VITALS — BP 120/83 | HR 111 | Temp 98.4°F | Wt 146.0 lb

## 2013-04-07 DIAGNOSIS — R0602 Shortness of breath: Secondary | ICD-10-CM

## 2013-04-07 DIAGNOSIS — R040 Epistaxis: Secondary | ICD-10-CM

## 2013-04-07 DIAGNOSIS — J441 Chronic obstructive pulmonary disease with (acute) exacerbation: Secondary | ICD-10-CM

## 2013-04-07 MED ORDER — PREDNISONE 20 MG PO TABS: 40.0000 mg | ORAL_TABLET | Freq: Every day | ORAL | Status: DC

## 2013-04-07 MED ORDER — DOXYCYCLINE HYCLATE 100 MG PO TABS: 100.0000 mg | ORAL_TABLET | Freq: Two times a day (BID) | ORAL | Status: DC

## 2013-04-07 NOTE — Patient Instructions (Signed)
Start using albuterol every 4 hours while awake.

## 2013-04-07 NOTE — Telephone Encounter (Signed)
Called and left message w/ pt's sister to tell her to call and schedule an appt to f/u for DM sometime next week.Gina Boyd, Gina Boyd

## 2013-04-07 NOTE — Progress Notes (Signed)
Subjective:    Patient ID: Gina Boyd, female    DOB: 26-Aug-1945, 68 y.o.   MRN: [id number]  HPI Nosebleed on Saturday AM . Has not bled since then. She says it was coming out of her nose and her mouth. She says ti woke her up that morning.  Not sure which side she was bleeding from.  She has had some nasal congestion and flet weak.  Denies any pain or sores inside the nose.  She is with her urologist later this afternoon for urinary frequency and leaking.  COPD - has had 2 days of chest congestion. Has had sweats. No fever or chills. No ST or ear pain.  NO ST.  cough is nonproductive and she has been more short of breath. She continues to smoke.   Review of Systems BP 120/83  Pulse 111  Temp(Src) 98.4 F (36.9 C)  Wt 146 lb (66.225 kg)  SpO2 94%    Allergies  Allergen Reactions  . Augmentin [Amoxicillin-Pot Clavulanate] Nausea Only  . Metformin And Related Other (See Comments)    GI intolerance.     Past Medical History  Diagnosis Date  . Hypercholesteremia   . Lumbar vertebral fracture   . Chest pain   . Palpitations   . History of shingles 2013    right face    Past Surgical History  Procedure Laterality Date  . Cholecystectomy    . Abdominal hysterectomy      for bleeding, Complete hys  . Appendectomy    . 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 Marital Status: Divorced    Spouse Name: N/A    Number of Children: 3  . Years of Education: N/A   Occupational History  . Retired.      Retired   Social History Main Topics  . Smoking status: Current Every Day Smoker -- 1.00 packs/day for 30 years    Types: Cigarettes  . Smokeless tobacco: Never Used  . Alcohol Use: No  . Drug Use: No  . Sexual Activity: No   Other Topics Concern  . Not on file   Social History Narrative   No regular exercise. 2 caffeine drinks per day. She's not sexually active. Currently lives with her sister Lavonna MonarchYvette Sparrow and brother-in-law.     Family History  Problem Relation Age of Onset  . Hypertension Father   . Diabetes Father   . Heart disease Sister     CHF  . Hyperlipidemia Sister   . Diabetes Sister   . Colon cancer Mother     colon  . Hyperlipidemia Mother   . COPD Sister     Outpatient Encounter Prescriptions as of 04/07/2013  Medication Sig  . Aclidinium Bromide 400 MCG/ACT AEPB 1 puff bid.  Marland Kitchen. albuterol (PROVENTIL HFA;VENTOLIN HFA) 108 (90 BASE) MCG/ACT inhaler Inhale 2 puffs into the lungs every 4 (four) hours as needed for wheezing.  . AMBULATORY NON FORMULARY MEDICATION Medication Name: glucometer with strips and lancets to test once a day. Dx.250.00  . AMBULATORY NON FORMULARY MEDICATION Medication Name: shingles vaccine IM x 1  . AMBULATORY NON FORMULARY MEDICATION Medication Name: The overnight pulse oximetry. Diagnosis hypoxemia. Short of breath. Please fax to home health.  . benzonatate (TESSALON) 200 MG capsule Take 1 capsule (200 mg total) by mouth at bedtime. Take as needed for cough  . clonazePAM (KLONOPIN) 0.5 MG tablet Take 0.5 mg by mouth 2 (two) times  daily as needed for anxiety (Rx by psych).  . cyclobenzaprine (FLEXERIL) 10 MG tablet take 1 tablet by mouth every 8 hours if needed for muscle spasm  . Diphenhyd-Hydrocort-Nystatin (FIRST-DUKES MOUTHWASH) SUSP Sig: Swish, gargle, and spit one to two teaspoonfuls every six hours as needed. May be swallowed if esophageal involvement. Shake well before using.  . Fluticasone-Salmeterol (ADVAIR) 250-50 MCG/DOSE AEPB Inhale 1 puff into the lungs 2 (two) times daily.  Marland Kitchen glipiZIDE (GLUCOTROL) 10 MG tablet take 1 tablet by mouth twice a day BEFORE A MEAL  . HYDROcodone-acetaminophen (NORCO/VICODIN) 5-325 MG per tablet Take 1 tablet by mouth 2 (two) times daily as needed for moderate pain. Take with food.  Marland Kitchen LYRICA 200 MG capsule take 1 capsule by mouth three times a day  . meloxicam (MOBIC) 15 MG tablet Take 1 tablet (15 mg total) by mouth daily.  .  nitroGLYCERIN (NITROSTAT) 0.3 MG SL tablet Place 1 tablet (0.3 mg total) under the tongue every 5 (five) minutes as needed for chest pain.  . pravastatin (PRAVACHOL) 40 MG tablet take 1 tablet by mouth at bedtime  . promethazine (PHENERGAN) 25 MG tablet Take 1 tablet (25 mg total) by mouth every 8 (eight) hours as needed for nausea or vomiting.  Marland Kitchen QUEtiapine (SEROQUEL) 100 MG tablet Take 100 mg by mouth at bedtime.  Marland Kitchen tiotropium (SPIRIVA) 18 MCG inhalation capsule Place 1 capsule (18 mcg total) into inhaler and inhale daily.  Marland Kitchen topiramate (TOPAMAX) 200 MG tablet Take 200 mg by mouth daily.   Marland Kitchen zolpidem (AMBIEN) 10 MG tablet Take 10 mg by mouth at bedtime as needed for sleep (rx by psych).  . chlorproMAZINE (THORAZINE) 25 MG tablet 25 mg.  . doxycycline (VIBRA-TABS) 100 MG tablet Take 1 tablet (100 mg total) by mouth 2 (two) times daily.  . predniSONE (DELTASONE) 20 MG tablet Take 2 tablets (40 mg total) by mouth daily.           Objective:   Physical Exam  Constitutional: She is oriented to person, place, and time. She appears well-developed and well-nourished.  HENT:  Head: Normocephalic and atraumatic.  Right Ear: External ear normal.  Left Ear: External ear normal.  Nose: Nose normal.  Mouth/Throat: Oropharynx is clear and moist.  TMs and canals are clear.   Eyes: Conjunctivae and EOM are normal. Pupils are equal, round, and reactive to light.  Neck: Neck supple. No thyromegaly present.  Cardiovascular: Normal rate, regular rhythm and normal heart sounds.   Pulmonary/Chest: Effort normal and breath sounds normal. She has no wheezes.  Lymphadenopathy:    She has no cervical adenopathy.  Neurological: She is alert and oriented to person, place, and time.  Skin: Skin is warm and dry.  Psychiatric: She has a normal mood and affect.          Assessment & Plan:  Nosebleed -  Gave reassurance. Unable to see a specific lesion on the nose to cauterize today. The right nares was  erythematous today. The left appeared to be normal. Recommend moisturize nasal passages with Aquaphor or petroleum jelly. Run a humidifier at bedtime.  COPD - Has had recurrent COPD exacerbations over this winter. Initiallly she had stopped using her inhalers and didn't tell us so thought this was the cause of some of her flares.  She mostly stays home  She has been wearing her sisters oxygen but has had a normal walk test in our office and has had neg overnight pulse oximetry.  Again reminded  her about smoking cessation. Acute exacerbation so will treat with doxycycline and prednisone for 5 days. We'll go ahead refer her to pulmonary. She's had multiple exacerbations in a fairly short period of time I think she would benefit from further evaluation. Normal 6 minute walk test. She was able to maintain her oxygen level at 95% during the entire ambulatory period.   Urinary symptoms-has followup appointment with her urologist at 4:00 this afternoon to discuss further.   Overdue for DM appt. Will schedule for next week.

## 2013-04-07 NOTE — Telephone Encounter (Signed)
She can use deslym OTC.  No narcotic cough meds.  Or we can call in tessalon perles.

## 2013-04-07 NOTE — Telephone Encounter (Signed)
please call pt: needs f/u DM appt scheduled for next week.

## 2013-04-07 NOTE — Telephone Encounter (Signed)
Pt was transferred from Misty's vm in triage and left the following vm stating that she would like to have a cough medicine written for her since she is having a lot of coughing.Loralee PacasBarkley, Robbie Rideaux GradyLynetta

## 2013-04-08 NOTE — Telephone Encounter (Signed)
Pt would like to have rx for tessalon sent.Heath GoldBarkley, Carson Meche Lynetta I also reminded her to make an appt for f/u for DM she said that she would call back once she began to feel better she went to the urologist yesterday and had a procedure done and she is in pain.Loralee PacasBarkley, Rein Popov MadisonLynetta

## 2013-04-12 ENCOUNTER — Emergency Department
Admission: EM | Admit: 2013-04-12 | Discharge: 2013-04-12 | Disposition: A | Discharge status: Home / Self Care | Payer: Medicare Other | Source: Home / Self Care

## 2013-04-21 ENCOUNTER — Ambulatory Visit: Payer: Medicare Other | Admitting: Physician Assistant

## 2013-04-29 ENCOUNTER — Ambulatory Visit (INDEPENDENT_AMBULATORY_CARE_PROVIDER_SITE_OTHER): Payer: Medicare Other

## 2013-04-29 ENCOUNTER — Encounter: Payer: Self-pay | Admitting: Physician Assistant

## 2013-04-29 ENCOUNTER — Ambulatory Visit (INDEPENDENT_AMBULATORY_CARE_PROVIDER_SITE_OTHER): Payer: Medicare Other | Admitting: Physician Assistant

## 2013-04-29 VITALS — BP 101/71 | HR 108 | Wt 142.0 lb

## 2013-04-29 DIAGNOSIS — J181 Lobar pneumonia, unspecified organism: Principal | ICD-10-CM

## 2013-04-29 DIAGNOSIS — J189 Pneumonia, unspecified organism: Secondary | ICD-10-CM

## 2013-04-29 DIAGNOSIS — R112 Nausea with vomiting, unspecified: Secondary | ICD-10-CM

## 2013-04-29 DIAGNOSIS — R1030 Lower abdominal pain, unspecified: Secondary | ICD-10-CM

## 2013-04-29 DIAGNOSIS — R918 Other nonspecific abnormal finding of lung field: Secondary | ICD-10-CM

## 2013-04-29 DIAGNOSIS — R109 Unspecified abdominal pain: Secondary | ICD-10-CM

## 2013-04-29 DIAGNOSIS — R222 Localized swelling, mass and lump, trunk: Secondary | ICD-10-CM

## 2013-04-29 MED ORDER — ONDANSETRON HCL 4 MG PO TABS
4.0000 mg | ORAL_TABLET | Freq: Three times a day (TID) | ORAL | Status: DC | PRN
Start: 2013-04-29 — End: 2013-05-29

## 2013-04-29 MED ORDER — HYDROCODONE-ACETAMINOPHEN 5-325 MG PO TABS: 1.0000 | ORAL_TABLET | Freq: Two times a day (BID) | ORAL | Status: DC | PRN

## 2013-04-29 NOTE — Progress Notes (Signed)
   Subjective:    Patient ID: Gina Boyd, female    DOB: [birth year], 68 y.o.   MRN: [id number]  HPI Patient is a 68 year old woman who presents to the clinic for followup after 04/23/2013 emergency room visit for pneumonia. She was given IV levaquin and twice. She does feel some better. She does have COPD. Pt is always SOB. Pulse ox at home has been 95%. Today 94%. She has no appetite. She has nausea almost all day long. She has ongoing abdominal pain. She continues to use all medications given.  She has finished her abx from hospital. She has appt in next couple of weeks with urology. Denies any painful urination or discharge. Needs refill on pain medication.     Review of Systems     Objective:   Physical Exam  Constitutional: She is oriented to person, place, and time. She appears well-developed and well-nourished.  HENT:  Head: Normocephalic and atraumatic.  Cardiovascular: Regular rhythm and normal heart sounds.   Rechecked pulse at 108.   Pulmonary/Chest: Effort normal and breath sounds normal.  A few expiratory wheezes heard.   Abdominal: Soft. Bowel sounds are normal. There is no tenderness.  While palpating lower abdominal area pt did not grimace or show any signs of pain.   Neurological: She is alert and oriented to person, place, and time.  Skin: Skin is dry.  Psychiatric: She has a normal mood and affect. Her behavior is normal.          Assessment & Plan:  Right lower lobe pneumonia-stat chest x-ray in office to determine if pneumonia had resolved. 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 Will follow up with chest CT.   Lower abdominal pain- UA in hospital was negative for lueks, nitrates, blood. Pt could not get urine sample today. Lower abdominal pain is not a new thing. She is being evaluated for it with urology. They are doing test and following up as directed. I instructed patient to get urine sample and bring in for  testing and culture if continues. Otherwise just follow up with urology.   Chronic pain syndrome- pt last fill was 03/21/13. Will refill today for one month.   Nausea/no appetite- discussed with patient could be from recent infection. Discussed need for nutrition with boost or ensure. Gave zofran for nausea to use as needed.

## 2013-04-29 NOTE — Patient Instructions (Addendum)
Will get CT scan.  Boost/Insure for diet and nutrition.

## 2013-04-30 ENCOUNTER — Telehealth: Payer: Self-pay | Admitting: *Deleted

## 2013-04-30 NOTE — Telephone Encounter (Signed)
Pt left vm on triage line & the message was transferred at 1:48 to my line..she stated that her pain in her bladder area has gotten much worse.

## 2013-04-30 NOTE — Telephone Encounter (Signed)
I cannot give any more pain medication. We have done every scan to evaluate for anything that can be treated. If pain is not controlled she need to go to ER.

## 2013-04-30 NOTE — Telephone Encounter (Signed)
Pt.notified

## 2013-05-06 ENCOUNTER — Telehealth: Payer: Self-pay | Admitting: *Deleted

## 2013-05-06 DIAGNOSIS — E119 Type 2 diabetes mellitus without complications: Secondary | ICD-10-CM

## 2013-05-09 ENCOUNTER — Other Ambulatory Visit: Payer: Self-pay | Admitting: Family Medicine

## 2013-05-11 LAB — HEMOGLOBIN A1C: Hgb A1c MFr Bld: 6 % (ref 4.0–6.0)

## 2013-05-15 ENCOUNTER — Other Ambulatory Visit: Payer: Self-pay | Admitting: *Deleted

## 2013-05-15 ENCOUNTER — Encounter: Payer: Self-pay | Admitting: Emergency Medicine

## 2013-05-15 ENCOUNTER — Emergency Department (INDEPENDENT_AMBULATORY_CARE_PROVIDER_SITE_OTHER)
Admission: EM | Admit: 2013-05-15 | Discharge: 2013-05-15 | Disposition: A | Discharge status: Home / Self Care | Payer: Medicare Other | Source: Home / Self Care | Attending: Family Medicine | Admitting: Family Medicine

## 2013-05-15 ENCOUNTER — Telehealth: Payer: Self-pay

## 2013-05-15 DIAGNOSIS — R300 Dysuria: Secondary | ICD-10-CM

## 2013-05-15 DIAGNOSIS — R109 Unspecified abdominal pain: Secondary | ICD-10-CM

## 2013-05-15 LAB — POCT URINALYSIS DIP (MANUAL ENTRY)
BILIRUBIN UA: NEGATIVE
BILIRUBIN UA: NEGATIVE
Blood, UA: NEGATIVE
Glucose, UA: NEGATIVE
Nitrite, UA: NEGATIVE
Protein Ur, POC: NEGATIVE
Spec Grav, UA: 1.01 (ref 1.005–1.03)
Urobilinogen, UA: 0.2 (ref 0–1)
pH, UA: 6.5 (ref 5–8)

## 2013-05-15 LAB — POCT CBC W AUTO DIFF (K'VILLE URGENT CARE)

## 2013-05-15 LAB — COMPREHENSIVE METABOLIC PANEL
ALBUMIN: 3.8 g/dL (ref 3.5–5.2)
ALT: 13 U/L (ref 0–35)
AST: 17 U/L (ref 0–37)
Alkaline Phosphatase: 146 U/L — ABNORMAL HIGH (ref 39–117)
BILIRUBIN TOTAL: 0.2 mg/dL (ref 0.2–1.2)
BUN: 9 mg/dL (ref 6–23)
CALCIUM: 9 mg/dL (ref 8.4–10.5)
CHLORIDE: 110 meq/L (ref 96–112)
CO2: 21 meq/L (ref 19–32)
Creat: 0.79 mg/dL (ref 0.50–1.10)
Glucose, Bld: 124 mg/dL — ABNORMAL HIGH (ref 70–99)
Potassium: 4.4 mEq/L (ref 3.5–5.3)
SODIUM: 143 meq/L (ref 135–145)
TOTAL PROTEIN: 6 g/dL (ref 6.0–8.3)

## 2013-05-15 MED ORDER — TRAMADOL HCL 50 MG PO TABS: 50.0000 mg | ORAL_TABLET | Freq: Four times a day (QID) | ORAL | Status: DC | PRN

## 2013-05-15 MED ORDER — CIPROFLOXACIN HCL 500 MG PO TABS: 500.0000 mg | ORAL_TABLET | Freq: Two times a day (BID) | ORAL | Status: DC

## 2013-05-15 NOTE — Telephone Encounter (Signed)
Called patient and she states she is going to the Urgent Care today. She states no burning, pressure or pain during urination.

## 2013-05-15 NOTE — Telephone Encounter (Signed)
If she thinks of some type of urinary symptoms we can certainly have her come in and put her on the nurse schedule. Otherwise I think she should definitely keep her appointment for the colonoscopy. Might help explain why she's having pelvic pain. Otherwise I would be happy see her on Monday.

## 2013-05-15 NOTE — ED Notes (Signed)
Lower abdominal pain, problems urinating, couldn't sleep last night

## 2013-05-15 NOTE — Telephone Encounter (Signed)
Gina Boyd complains of low pelvic pain for 1 month, worse for 1 week. Denies fevers or sweats. She states she does have chills. She wants to see Dr Linford ArnoldMetheney today. I advised her Dr Linford ArnoldMetheney is completely booked and I would be happy to schedule her tomorrow with another provider or she could go to the Urgent Care today. She does not want to see another provider. She also noted that she has her colonoscopy tomorrow so she could not be seen tomorrow.

## 2013-05-16 ENCOUNTER — Encounter: Payer: Self-pay | Admitting: *Deleted

## 2013-05-16 ENCOUNTER — Other Ambulatory Visit: Payer: Self-pay | Admitting: Family Medicine

## 2013-05-17 ENCOUNTER — Other Ambulatory Visit: Payer: Self-pay | Admitting: Physician Assistant

## 2013-05-17 ENCOUNTER — Encounter: Payer: Self-pay | Admitting: Physician Assistant

## 2013-05-17 ENCOUNTER — Ambulatory Visit (HOSPITAL_BASED_OUTPATIENT_CLINIC_OR_DEPARTMENT_OTHER)
Admission: RE | Admit: 2013-05-17 | Discharge: 2013-05-17 | Disposition: A | Discharge status: Home / Self Care | Payer: Medicare Other | Source: Ambulatory Visit | Attending: Physician Assistant | Admitting: Physician Assistant

## 2013-05-17 ENCOUNTER — Encounter (HOSPITAL_BASED_OUTPATIENT_CLINIC_OR_DEPARTMENT_OTHER): Payer: Self-pay

## 2013-05-17 DIAGNOSIS — R918 Other nonspecific abnormal finding of lung field: Secondary | ICD-10-CM

## 2013-05-17 DIAGNOSIS — J181 Lobar pneumonia, unspecified organism: Principal | ICD-10-CM

## 2013-05-17 DIAGNOSIS — J189 Pneumonia, unspecified organism: Secondary | ICD-10-CM

## 2013-05-17 DIAGNOSIS — J984 Other disorders of lung: Secondary | ICD-10-CM | POA: Insufficient documentation

## 2013-05-17 HISTORY — DX: Chronic obstructive pulmonary disease, unspecified: J44.9

## 2013-05-17 HISTORY — DX: Type 2 diabetes mellitus without complications: E11.9

## 2013-05-17 HISTORY — DX: Nicotine dependence, unspecified, uncomplicated: F17.200

## 2013-05-20 ENCOUNTER — Encounter: Payer: Self-pay | Admitting: *Deleted

## 2013-05-22 ENCOUNTER — Ambulatory Visit: Payer: Medicare Other | Admitting: Family Medicine

## 2013-05-29 ENCOUNTER — Ambulatory Visit (INDEPENDENT_AMBULATORY_CARE_PROVIDER_SITE_OTHER): Payer: Medicare Other | Admitting: Family Medicine

## 2013-05-29 VITALS — BP 101/62 | HR 106 | Temp 98.1°F | Wt 146.0 lb

## 2013-05-29 DIAGNOSIS — N949 Unspecified condition associated with female genital organs and menstrual cycle: Secondary | ICD-10-CM

## 2013-05-29 DIAGNOSIS — G43909 Migraine, unspecified, not intractable, without status migrainosus: Secondary | ICD-10-CM

## 2013-05-29 DIAGNOSIS — R102 Pelvic and perineal pain: Secondary | ICD-10-CM

## 2013-05-29 MED ORDER — HYDROCODONE-ACETAMINOPHEN 5-325 MG PO TABS: 1.0000 | ORAL_TABLET | Freq: Two times a day (BID) | ORAL | Status: AC | PRN

## 2013-05-29 NOTE — Progress Notes (Signed)
   Subjective:    Patient ID: Gina Boyd, female    DOB: 08-13-1945, 68 y.o.   MRN: [id number]  HPI Baldder pain and pelvis for 2 months. Pain is 10/10.  Feels better when lying down.  No difficulty urinating.  No inc frequency.  Has seen 2 urologist.  S[facility] They recommended she see gyn.  [place] app[place] [facility] [place] [facility]YN [place].  No dysuria or hematura.  Feels like pain is more vaginal at times.    Has rescheduled her colonocopy for April.    She is followed at the [facility] on [address].  She says she would like me to take over Rx her HA medications including flexeril, topamax, etc. Says really can't afford the copays anymore.     Review of Systems       Objective:   Physical Exam  Constitutional: She is oriented to person, place, and time. She appears well-developed and well-nourished.  HENT:  Head: Normocephalic and atraumatic.  Cardiovascular: Normal rate, regular rhythm and normal heart sounds.   Pulmonary/Chest: Effort normal and breath sounds normal.  Neurological: She is alert and oriented to person, place, and time.  Skin: Skin is warm and dry.  Psychiatric: She has a normal mood and affect. Her behavior is normal.          Assessment & Plan:  Plevic pain - has appt with gyn. Could be prolpase or atrophy. Encouraged her to keep her appt.  Did refill her pain meds for one more month.  She HAS to go to appt tomorrow.  She was actually due for her pain medications which are normally for her chronic back and neck pain.  Migraine HA - Asked her to get a release of records so that I know whats she is taking and how it is being Rx from the [facility] clinic.    Encouraged her to keep her appointment for her followup colonoscopy.

## 2013-05-30 ENCOUNTER — Encounter: Payer: Self-pay | Admitting: Family Medicine

## 2013-06-03 ENCOUNTER — Other Ambulatory Visit: Payer: Self-pay | Admitting: Family Medicine

## 2013-06-03 ENCOUNTER — Other Ambulatory Visit: Payer: Self-pay | Admitting: *Deleted

## 2013-06-03 MED ORDER — ONDANSETRON HCL 4 MG PO TABS: 4.0000 mg | ORAL_TABLET | Freq: Three times a day (TID) | ORAL | Status: AC | PRN

## 2013-06-03 NOTE — Telephone Encounter (Signed)
We have not filled this? Do you want to fill? Barry DienesKimberly Tzvi Economou, LPN

## 2013-06-04 ENCOUNTER — Other Ambulatory Visit: Payer: Self-pay | Admitting: Internal Medicine

## 2013-06-04 DIAGNOSIS — R109 Unspecified abdominal pain: Secondary | ICD-10-CM

## 2013-06-10 ENCOUNTER — Encounter: Payer: Medicare Other | Admitting: Obstetrics & Gynecology

## 2013-06-10 DIAGNOSIS — N949 Unspecified condition associated with female genital organs and menstrual cycle: Secondary | ICD-10-CM

## 2013-06-12 ENCOUNTER — Other Ambulatory Visit: Payer: Medicare Other

## 2013-06-20 ENCOUNTER — Other Ambulatory Visit: Payer: Self-pay | Admitting: Family Medicine

## 2013-06-23 ENCOUNTER — Ambulatory Visit (INDEPENDENT_AMBULATORY_CARE_PROVIDER_SITE_OTHER): Payer: Medicare Other

## 2013-06-23 DIAGNOSIS — K5900 Constipation, unspecified: Secondary | ICD-10-CM

## 2013-06-23 DIAGNOSIS — R109 Unspecified abdominal pain: Secondary | ICD-10-CM

## 2013-08-07 ENCOUNTER — Other Ambulatory Visit: Payer: Self-pay | Admitting: Family Medicine

## 2013-08-21 ENCOUNTER — Telehealth: Payer: Self-pay | Admitting: *Deleted

## 2013-08-21 NOTE — Telephone Encounter (Signed)
tawanna from [facility] called and would like for Dr. Linford ArnoldMetheney to place an order for social worker consult for Gina Boyd due to her drug seeking, and inability to manage her pain. Order placed.Loralee PacasBarkley, Tonya SenecaLynetta

## 2013-08-23 ENCOUNTER — Encounter (HOSPITAL_BASED_OUTPATIENT_CLINIC_OR_DEPARTMENT_OTHER): Payer: Self-pay | Admitting: Emergency Medicine

## 2013-08-23 ENCOUNTER — Emergency Department (HOSPITAL_BASED_OUTPATIENT_CLINIC_OR_DEPARTMENT_OTHER)
Admission: EM | Admit: 2013-08-23 | Discharge: 2013-08-23 | Disposition: A | Discharge status: Home / Self Care | Payer: Medicare Other | Attending: Emergency Medicine | Admitting: Emergency Medicine

## 2013-08-23 DIAGNOSIS — E119 Type 2 diabetes mellitus without complications: Secondary | ICD-10-CM | POA: Diagnosis not present

## 2013-08-23 DIAGNOSIS — Z792 Long term (current) use of antibiotics: Secondary | ICD-10-CM | POA: Diagnosis not present

## 2013-08-23 DIAGNOSIS — IMO0002 Reserved for concepts with insufficient information to code with codable children: Secondary | ICD-10-CM | POA: Insufficient documentation

## 2013-08-23 DIAGNOSIS — E785 Hyperlipidemia, unspecified: Secondary | ICD-10-CM | POA: Diagnosis not present

## 2013-08-23 DIAGNOSIS — N949 Unspecified condition associated with female genital organs and menstrual cycle: Secondary | ICD-10-CM | POA: Insufficient documentation

## 2013-08-23 DIAGNOSIS — Z79899 Other long term (current) drug therapy: Secondary | ICD-10-CM | POA: Diagnosis not present

## 2013-08-23 DIAGNOSIS — R102 Pelvic and perineal pain: Secondary | ICD-10-CM

## 2013-08-23 DIAGNOSIS — J449 Chronic obstructive pulmonary disease, unspecified: Secondary | ICD-10-CM | POA: Diagnosis not present

## 2013-08-23 DIAGNOSIS — Z8619 Personal history of other infectious and parasitic diseases: Secondary | ICD-10-CM | POA: Diagnosis not present

## 2013-08-23 DIAGNOSIS — F17200 Nicotine dependence, unspecified, uncomplicated: Secondary | ICD-10-CM | POA: Diagnosis not present

## 2013-08-23 DIAGNOSIS — J4489 Other specified chronic obstructive pulmonary disease: Secondary | ICD-10-CM | POA: Insufficient documentation

## 2013-08-23 DIAGNOSIS — Z8781 Personal history of (healed) traumatic fracture: Secondary | ICD-10-CM | POA: Diagnosis not present

## 2013-08-23 LAB — URINE MICROSCOPIC-ADD ON

## 2013-08-23 LAB — URINALYSIS, ROUTINE W REFLEX MICROSCOPIC
Bilirubin Urine: NEGATIVE
Glucose, UA: NEGATIVE mg/dL
HGB URINE DIPSTICK: NEGATIVE
Ketones, ur: NEGATIVE mg/dL
Nitrite: NEGATIVE
Protein, ur: NEGATIVE mg/dL
SPECIFIC GRAVITY, URINE: 1.012 (ref 1.005–1.030)
UROBILINOGEN UA: 0.2 mg/dL (ref 0.0–1.0)
pH: 7 (ref 5.0–8.0)

## 2013-08-23 MED ORDER — OXYCODONE-ACETAMINOPHEN 5-325 MG PO TABS
2.0000 | ORAL_TABLET | Freq: Once | ORAL | Status: AC
  Administered 2013-08-23: 2 via ORAL
  Filled 2013-08-23: qty 2

## 2013-08-23 MED ORDER — OXYCODONE-ACETAMINOPHEN 5-325 MG PO TABS: 2.0000 | ORAL_TABLET | Freq: Three times a day (TID) | ORAL | Status: AC | PRN

## 2013-08-23 MED ORDER — CEPHALEXIN 500 MG PO CAPS
ORAL_CAPSULE | ORAL | Status: AC
Start: 2013-08-23 — End: ?

## 2013-08-23 NOTE — ED Notes (Signed)
Pt reports 5 days of lower abdominal "in my pelvis" pain that is similar to previous symptoms "I guess it was from an infection." Denies discharge, dysuria.

## 2013-08-23 NOTE — Discharge Instructions (Signed)

## 2013-08-23 NOTE — ED Provider Notes (Signed)
CSN: [id number]     Arrival date & time 08/23/13  1325 History   First MD Initiated Contact with Patient 08/23/13 1416     Chief Complaint  Patient presents with  . Pelvic Pain     (Consider location/radiation/quality/duration/timing/severity/associated sxs/prior Treatment) HPI 68 year old female presents with several days of dysuria urinary frequency and constant suprapubic pelvic pain with history of hysterectomy in the past; patient states she feels that she has a urine infection, patient denies fever upper abdominal pain right lower quadrant pain left lower quadrant pain back pain vomiting diarrhea or other concerns. There is no treatment prior to arrival. Patient has a history of chronic severe neck and back pain with a message in the electronic health records from her primary care Dr. stating the primary care office will no longer prescribe opiates to the patient unless there is an acute injury reason. Past Medical History  Diagnosis Date  . Hypercholesteremia   . Lumbar vertebral fracture   . Chest pain   . Palpitations   . History of shingles 2013    right face  . Diabetes mellitus without complication   . Smoker   . COPD (chronic obstructive pulmonary disease)    Past Surgical History  Procedure Laterality Date  . Cholecystectomy    . Abdominal hysterectomy      for bleeding, Complete hys  . Appendectomy    . 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 Hypertension Father   . Diabetes Father   . Heart disease Sister     CHF  . Hyperlipidemia Sister   . Diabetes Sister   . Colon cancer Mother     colon  . Hyperlipidemia Mother   . COPD Sister    History  Substance Use Topics  . Smoking status: Current Every Day Smoker -- 1.00 packs/day for 30 years    Types: Cigarettes  . Smokeless tobacco: Never Used  . Alcohol Use: No   OB History   Grav Para Term Preterm Abortions TAB SAB Ect Mult Living                 Review of  Systems 10 Systems reviewed and are negative for acute change except as noted in the HPI.   Allergies  Augmentin and Metformin and related  Home Medications   Prior to Admission medications   Medication Sig Start Date End Date Taking? Authorizing Provider  Aclidinium Bromide 400 MCG/ACT AEPB 1 puff bid. 03/31/13   Jade L Breeback, PA-C  albuterol (PROVENTIL HFA;VENTOLIN HFA) 108 (90 BASE) MCG/ACT inhaler Inhale 2 puffs into the lungs every 4 (four) hours as needed for wheezing. 03/09/13   Lattie HawStephen A Beese, MD  AMBULATORY NON FORMULARY MEDICATION Medication Name: glucometer with strips and lancets to test once a day. Dx.250.00 05/28/12   Agapito Gamesatherine D Metheney, MD  AMBULATORY NON FORMULARY MEDICATION Medication Name: shingles vaccine IM x 1 11/12/12   Agapito Gamesatherine D Metheney, MD  AMBULATORY NON FORMULARY MEDICATION Medication Name: The overnight pulse oximetry. Diagnosis hypoxemia. Short of breath. Please fax to home health. 03/25/13   Agapito Gamesatherine D Metheney, MD  cephALEXin (KEFLEX) 500 MG capsule 2 caps po bid x 7 days 08/23/13   Hurman HornJohn M Bednar, MD  clonazePAM (KLONOPIN) 0.5 MG tablet Take 0.5 mg by mouth 2 (two) times daily as needed for anxiety (Rx by psych).    Historical Provider, MD  cyclobenzaprine (FLEXERIL) 10 MG tablet take 1 tablet  by mouth every 8 hours if needed for muscle spasm 12/06/12   Historical Provider, MD  Diphenhyd-Hydrocort-Nystatin (FIRST-DUKES MOUTHWASH) SUSP Sig: Swish, gargle, and spit one to two teaspoonfuls every six hours as needed. May be swallowed if esophageal involvement. Shake well before using. 03/27/13   Agapito Gamesatherine D Metheney, MD  Fluticasone-Salmeterol (ADVAIR) 250-50 MCG/DOSE AEPB Inhale 1 puff into the lungs 2 (two) times daily. 04/02/13   Agapito Gamesatherine D Metheney, MD  glipiZIDE (GLUCOTROL) 10 MG tablet take 1 tablet by mouth twice a day before meals 08/07/13   Agapito Gamesatherine D Metheney, MD  HYDROcodone-acetaminophen (NORCO/VICODIN) 5-325 MG per tablet Take 1 tablet by mouth 2 (two)  times daily as needed for moderate pain. Take with food. 05/29/13   Agapito Gamesatherine D Metheney, MD  LYRICA 200 MG capsule take 1 capsule by mouth three times a day    Agapito Gamesatherine D Metheney, MD  meloxicam (MOBIC) 15 MG tablet Take 1 tablet (15 mg total) by mouth daily. 02/12/13   Jade L Breeback, PA-C  nitroGLYCERIN (NITROSTAT) 0.3 MG SL tablet Place 1 tablet (0.3 mg total) under the tongue every 5 (five) minutes as needed for chest pain. 02/12/13   Jade L Breeback, PA-C  ondansetron (ZOFRAN) 4 MG tablet Take 1 tablet (4 mg total) by mouth every 8 (eight) hours as needed for nausea or vomiting. 06/03/13   Agapito Gamesatherine D Metheney, MD  oxyCODONE-acetaminophen (PERCOCET) 5-325 MG per tablet Take 2 tablets by mouth every 8 (eight) hours as needed for severe pain. 08/23/13   Hurman HornJohn M Bednar, MD  pravastatin (PRAVACHOL) 40 MG tablet take 1 tablet by mouth at bedtime 01/05/13   Agapito Gamesatherine D Metheney, MD  promethazine (PHENERGAN) 25 MG tablet take 1 tablet by mouth every 8 hours if needed for nausea and vomiting    Agapito Gamesatherine D Metheney, MD  QUEtiapine (SEROQUEL) 100 MG tablet Take 100 mg by mouth at bedtime.    Historical Provider, MD  tiotropium (SPIRIVA) 18 MCG inhalation capsule Place 1 capsule (18 mcg total) into inhaler and inhale daily. 04/04/13   Jade L Breeback, PA-C  topiramate (TOPAMAX) 200 MG tablet Take 200 mg by mouth daily.     Historical Provider, MD  zolpidem (AMBIEN) 10 MG tablet Take 10 mg by mouth at bedtime as needed for sleep (rx by psych).    Historical Provider, MD   BP 114/67  Pulse 77  Temp(Src) 97.9 F (36.6 C) (Oral)  Resp 20  Ht 5\' 8"  (1.727 m)  Wt 135 lb (61.236 kg)  BMI 20.53 kg/m2  SpO2 98% Physical Exam  Nursing note and vitals reviewed. Constitutional:  Awake, alert, nontoxic appearance.  HENT:  Head: Atraumatic.  Eyes: Right eye exhibits no discharge. Left eye exhibits no discharge.  Neck: Neck supple.  Cardiovascular: Normal rate and regular rhythm.   No murmur  heard. Pulmonary/Chest: Effort normal and breath sounds normal. No respiratory distress. She has no wheezes. She has no rales. She exhibits no tenderness.  Abdominal: Soft. Bowel sounds are normal. She exhibits no distension and no mass. There is tenderness. There is no rebound and no guarding.  Minimal suprapubic tenderness only the rest of the abdomen is nontender and there is no CVA tenderness  Musculoskeletal: She exhibits no tenderness.  Baseline ROM, no obvious new focal weakness.  Neurological: She is alert.  Mental status and motor strength appears baseline for patient and situation.  Skin: No rash noted.  Psychiatric: She has a normal mood and affect.    ED Course  Procedures (  including critical care time) Patient informed of clinical course, understand medical decision-making process, and agree with plan.  Labs Review Labs Reviewed  URINALYSIS, ROUTINE W REFLEX MICROSCOPIC - Abnormal; Notable for the following:    APPearance CLOUDY (*)    Leukocytes, UA SMALL (*)    All other components within normal limits  URINE MICROSCOPIC-ADD ON - Abnormal; Notable for the following:    Squamous Epithelial / LPF MANY (*)    Bacteria, UA MANY (*)    All other components within normal limits    Imaging Review No results found.   EKG Interpretation None      MDM   Final diagnoses:  Pelvic pain in female    I doubt any other EMC precluding discharge at this time including, but not necessarily limited to the following:peritonitis, sepsis.    Hurman Horn, MD 08/25/13 [phone]

## 2013-08-25 NOTE — Telephone Encounter (Signed)
Spoke w. Tawanna and she took VO for this.Loralee PacasBarkley, Tonya GonzalesLynetta

## 2013-10-06 ENCOUNTER — Other Ambulatory Visit: Payer: Self-pay | Admitting: Physician Assistant

## 2013-10-09 ENCOUNTER — Encounter: Payer: Self-pay | Admitting: Emergency Medicine

## 2013-10-09 ENCOUNTER — Emergency Department (INDEPENDENT_AMBULATORY_CARE_PROVIDER_SITE_OTHER)
Admission: EM | Admit: 2013-10-09 | Discharge: 2013-10-09 | Disposition: A | Discharge status: Home / Self Care | Payer: Medicare Other | Source: Home / Self Care | Attending: Emergency Medicine | Admitting: Emergency Medicine

## 2013-10-09 DIAGNOSIS — Z765 Malingerer [conscious simulation]: Secondary | ICD-10-CM

## 2013-10-09 DIAGNOSIS — F1910 Other psychoactive substance abuse, uncomplicated: Secondary | ICD-10-CM

## 2013-10-09 DIAGNOSIS — R300 Dysuria: Secondary | ICD-10-CM

## 2013-10-09 DIAGNOSIS — N949 Unspecified condition associated with female genital organs and menstrual cycle: Secondary | ICD-10-CM

## 2013-10-09 DIAGNOSIS — R102 Pelvic and perineal pain: Principal | ICD-10-CM

## 2013-10-09 DIAGNOSIS — G8929 Other chronic pain: Secondary | ICD-10-CM

## 2013-10-09 LAB — POCT URINALYSIS DIP (MANUAL ENTRY)
Bilirubin, UA: NEGATIVE
Blood, UA: NEGATIVE
Glucose, UA: NEGATIVE
Ketones, POC UA: NEGATIVE
Leukocytes, UA: NEGATIVE
Nitrite, UA: NEGATIVE
Protein Ur, POC: NEGATIVE
Spec Grav, UA: 1.015 (ref 1.005–1.03)
Urobilinogen, UA: 0.2 (ref 0–1)
pH, UA: 6 (ref 5–8)

## 2013-10-09 MED ORDER — TRAMADOL-ACETAMINOPHEN 37.5-325 MG PO TABS: ORAL_TABLET | ORAL | Status: AC

## 2013-10-09 NOTE — ED Provider Notes (Addendum)
CSN: [id number]     Arrival date & time 10/09/13  1214 History   First MD Initiated Contact with Patient 10/09/13 1220     Chief Complaint  Patient presents with  . Abdominal Pain    HPI This is a 68 y.o. female who presents today with vague, mild lower abdominal pain x 2 days and she states she believes she might have a bladder infection.--- She is a very vague historian   Occasional dysuria Occasional frequency No urgency No hematuria No vaginal discharge No fever/chills No lower abdominal pain.--I requestioned her about this, and she was extremely vague and stated that she has chronic pelvic pain evaluated by GYN in the past, status post total abdominal hysterectomy in the past. No nausea No vomiting No new back pain No acute fatigue. No syncope or focal neurologic symptoms.  She specifically requests prescription for Ultracet, as that relieves the pain. She states she's been taking Ultracet, 3 or 4 times a day as needed for this pain.--She states she was seen by another physician in July who prescribed a small amount of Ultracet, but she states she has run out of the Ultracet.  Looking through records in the chart, Dr. Linford Arnold is listed as her PCP, but last saw Dr. Linford Arnold 05/30/2013. Various documentations in chart regarding referral to pain management clinic, but patient has been noncompliant with going to pain clinic. The chart documents various visits to providers where patient exhibits drug-seeking behavior.--History of chronic headaches and chronic back and neck pain.   Was seen at [facility] ER on 08/23/13, for a UTI and prescribed cephalexin and Percocet. Past Medical History  Diagnosis Date  . Hypercholesteremia   . Lumbar vertebral fracture   . Chest pain   . Palpitations   . History of shingles 2013    right face  . Diabetes mellitus without complication   . Smoker   . COPD (chronic obstructive pulmonary disease)    Past Surgical History  Procedure  Laterality Date  . Cholecystectomy    . Abdominal hysterectomy      for bleeding, Complete hys  . Appendectomy    . 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 Hypertension Father   . Diabetes Father   . Heart disease Sister     CHF  . Hyperlipidemia Sister   . Diabetes Sister   . Colon cancer Mother     colon  . Hyperlipidemia Mother   . COPD Sister    History  Substance Use Topics  . Smoking status: Current Every Day Smoker -- 1.00 packs/day for 30 years    Types: Cigarettes  . Smokeless tobacco: Never Used  . Alcohol Use: No   OB History   Grav Para Term Preterm Abortions TAB SAB Ect Mult Living                 Review of Systems  Constitutional: Negative for fever and chills.  Respiratory: Negative for shortness of breath.   Cardiovascular: Negative for chest pain.  Gastrointestinal: Negative for nausea, vomiting, diarrhea and blood in stool.  Skin: Negative for rash.  Psychiatric/Behavioral: Negative for suicidal ideas, hallucinations and confusion.  All other systems reviewed and are negative.   Allergies  Augmentin and Metformin and related  Home Medications   Prior to Admission medications   Medication Sig Start Date End Date Taking? Authorizing Provider  Aclidinium Bromide 400 MCG/ACT AEPB 1 puff  bid. 03/31/13   Jomarie Longs, PA-C  albuterol (PROVENTIL HFA;VENTOLIN HFA) 108 (90 BASE) MCG/ACT inhaler Inhale 2 puffs into the lungs every 4 (four) hours as needed for wheezing. 03/09/13   Lattie Haw, MD  AMBULATORY NON FORMULARY MEDICATION Medication Name: glucometer with strips and lancets to test once a day. Dx.250.00 05/28/12   Agapito Games, MD  AMBULATORY NON FORMULARY MEDICATION Medication Name: shingles vaccine IM x 1 11/12/12   Agapito Games, MD  AMBULATORY NON FORMULARY MEDICATION Medication Name: The overnight pulse oximetry. Diagnosis hypoxemia. Short of breath. Please fax to home health. 03/25/13    Agapito Games, MD  cephALEXin (KEFLEX) 500 MG capsule 2 caps po bid x 7 days 08/23/13   Hurman Horn, MD  clonazePAM (KLONOPIN) 0.5 MG tablet Take 0.5 mg by mouth 2 (two) times daily as needed for anxiety (Rx by psych).    Historical Provider, MD  cyclobenzaprine (FLEXERIL) 10 MG tablet take 1 tablet by mouth every 8 hours if needed for muscle spasm 12/06/12   Historical Provider, MD  Diphenhyd-Hydrocort-Nystatin (FIRST-DUKES MOUTHWASH) SUSP Sig: Swish, gargle, and spit one to two teaspoonfuls every six hours as needed. May be swallowed if esophageal involvement. Shake well before using. 03/27/13   Agapito Games, MD  Fluticasone-Salmeterol (ADVAIR) 250-50 MCG/DOSE AEPB Inhale 1 puff into the lungs 2 (two) times daily. 04/02/13   Agapito Games, MD  glipiZIDE (GLUCOTROL) 10 MG tablet take 1 tablet by mouth twice a day before meals 08/07/13   Agapito Games, MD  HYDROcodone-acetaminophen (NORCO/VICODIN) 5-325 MG per tablet Take 1 tablet by mouth 2 (two) times daily as needed for moderate pain. Take with food. 05/29/13   Agapito Games, MD  LYRICA 200 MG capsule take 1 capsule by mouth three times a day    Agapito Games, MD  meloxicam (MOBIC) 15 MG tablet Take 1 tablet (15 mg total) by mouth daily. 02/12/13   Jade L Breeback, PA-C  NITROSTAT 0.3 MG SL tablet place 1 tablet under the tongue every 5 MINUTES if needed    Jade L Breeback, PA-C  ondansetron (ZOFRAN) 4 MG tablet Take 1 tablet (4 mg total) by mouth every 8 (eight) hours as needed for nausea or vomiting. 06/03/13   Agapito Games, MD  oxyCODONE-acetaminophen (PERCOCET) 5-325 MG per tablet Take 2 tablets by mouth every 8 (eight) hours as needed for severe pain. 08/23/13   Hurman Horn, MD  pravastatin (PRAVACHOL) 40 MG tablet take 1 tablet by mouth at bedtime 01/05/13   Agapito Games, MD  promethazine (PHENERGAN) 25 MG tablet take 1 tablet by mouth every 8 hours if needed for nausea and vomiting     Agapito Games, MD  QUEtiapine (SEROQUEL) 100 MG tablet Take 100 mg by mouth at bedtime.    Historical Provider, MD  tiotropium (SPIRIVA) 18 MCG inhalation capsule Place 1 capsule (18 mcg total) into inhaler and inhale daily. 04/04/13   Jade L Breeback, PA-C  topiramate (TOPAMAX) 200 MG tablet Take 200 mg by mouth daily.     Historical Provider, MD  traMADol-acetaminophen (ULTRACET) 37.5-325 MG per tablet 1 or 2 every 4-6 hours as needed for moderate-severe pain.  Caution: May cause drowsiness. Any further refills would need to be done by your PCP or a chronic pain specialist 10/09/13   Lajean Manes, MD  zolpidem (AMBIEN) 10 MG tablet Take 10 mg by mouth at bedtime as needed for sleep (rx by psych).  Historical Provider, MD   BP 100/69  Pulse 107  Temp(Src) 98.4 F (36.9 C) (Oral)  Resp 18  Ht 5\' 8"  (1.727 m)  Wt 144 lb (65.318 kg)  BMI 21.90 kg/m2  SpO2 97% Physical Exam  Nursing note and vitals reviewed. Constitutional: She is oriented to person, place, and time. She appears well-developed and well-nourished. No distress.  HENT:  Mouth/Throat: Oropharynx is clear and moist.  Eyes: No scleral icterus.  Neck: Neck supple.  Cardiovascular: Normal rate, regular rhythm and normal heart sounds.   Pulmonary/Chest: Breath sounds normal.  Abdominal: Soft. She exhibits no mass. There is no hepatosplenomegaly. There is no tenderness. There is no rebound, no guarding and no CVA tenderness.  Abdominal exam negative. At first she had vague mild discomfort to palpation suprapubic area, but when she was distracted, abdomen was completely nontender. GYN exam deferred to her gynecologist  Lymphadenopathy:    She has no cervical adenopathy.  Neurological: She is alert and oriented to person, place, and time.  Skin: Skin is warm and dry. No rash noted.  Psychiatric: Her mood appears anxious (Mild). Her speech is tangential. She is not actively hallucinating. She expresses no homicidal and no  suicidal ideation.    ED Course  Procedures (including critical care time) Labs Review Labs Reviewed  URINE CULTURE  POCT URINALYSIS DIP (MANUAL ENTRY)   Results for orders placed during the hospital encounter of 10/09/13  POCT URINALYSIS DIP (MANUAL ENTRY)      Result Value Ref Range   Color, UA yellow     Clarity, UA clear     Glucose, UA neg     Bilirubin, UA negative     Bilirubin, UA negative     Spec Grav, UA 1.015  1.005 - 1.03   Blood, UA negative     pH, UA 6.0  5 - 8   Protein Ur, POC negative     Urobilinogen, UA 0.2  0 - 1   Nitrite, UA Negative     Leukocytes, UA Negative       Imaging Review No results found.   MDM   1. Chronic pelvic pain in female   2. Dysuria   3. Drug-seeking behavior     Over 30 minutes spent, greater than 50% of the time spent for counseling and coordination of care. Explained to patient that there is no evidence of UTI. Advised her to followup with her gynecologist for chronic pelvic pain.  I urged her to see pain specialist for her chronic pain syndrome.----I explained the risks of not doing so. She still requested a refill of Ultracet to get her through until she sees Dr. Linford ArnoldMetheney. I agreed to prescribe #30 Ultracet, printed prescription given. Precautions discussed.  1:43 pm [facility] called us back, stating that pt was seen at an ER on 09/25/13, (14 days ago)  prescribed #90 Ultracet. (We have no record of any [facility] visit in July at [facility])--Based on this, we advised pharmacist NOT to fill the prescription for Ultracet, as patient was not honest with her history.  Further review of notes in chart reveal that she is been prescribed controlled drugs for pain over the years and although she has been advised to see a chronic pain specialist, she has not seen one.   Lajean Manesavid Massey, MD 10/12/13 1306  Lajean Manesavid Massey, MD 10/12/13 1336

## 2013-10-09 NOTE — ED Notes (Signed)
States has had lower abdominal pain x 2 days and believes she might have a bladder infection.

## 2013-10-10 ENCOUNTER — Telehealth: Payer: Self-pay | Admitting: Emergency Medicine

## 2013-10-13 ENCOUNTER — Telehealth: Payer: Self-pay | Admitting: Cardiology

## 2013-10-13 NOTE — Telephone Encounter (Signed)
°  Patient called in with tightness/chest pain & SOB. Patient said [place] had [place]. I explained the [place]shaw [place] today. And advise her she could call 911. Patient was uncooperative and [facility].

## 2013-10-13 NOTE — Telephone Encounter (Signed)
Spoke with pt who reports she has no one to help her or drive her anywhere.  She has been having chest pain since Saturday. Taking NTG with no relief. I advised pt to call 911 for transport to ED.

## 2013-10-16 ENCOUNTER — Encounter (HOSPITAL_BASED_OUTPATIENT_CLINIC_OR_DEPARTMENT_OTHER): Payer: Self-pay | Admitting: Emergency Medicine

## 2013-10-16 ENCOUNTER — Emergency Department (HOSPITAL_BASED_OUTPATIENT_CLINIC_OR_DEPARTMENT_OTHER): Payer: Medicare Other

## 2013-10-16 ENCOUNTER — Emergency Department (HOSPITAL_BASED_OUTPATIENT_CLINIC_OR_DEPARTMENT_OTHER)
Admission: EM | Admit: 2013-10-16 | Discharge: 2013-10-16 | Discharge status: Against Medical Advice | Payer: Medicare Other | Attending: Emergency Medicine | Admitting: Emergency Medicine

## 2013-10-16 DIAGNOSIS — Z79899 Other long term (current) drug therapy: Secondary | ICD-10-CM | POA: Diagnosis not present

## 2013-10-16 DIAGNOSIS — G8929 Other chronic pain: Secondary | ICD-10-CM | POA: Diagnosis not present

## 2013-10-16 DIAGNOSIS — E7800 Pure hypercholesterolemia, unspecified: Secondary | ICD-10-CM | POA: Insufficient documentation

## 2013-10-16 DIAGNOSIS — F17200 Nicotine dependence, unspecified, uncomplicated: Secondary | ICD-10-CM | POA: Diagnosis not present

## 2013-10-16 DIAGNOSIS — Z8781 Personal history of (healed) traumatic fracture: Secondary | ICD-10-CM | POA: Insufficient documentation

## 2013-10-16 DIAGNOSIS — R109 Unspecified abdominal pain: Secondary | ICD-10-CM | POA: Insufficient documentation

## 2013-10-16 DIAGNOSIS — R062 Wheezing: Secondary | ICD-10-CM

## 2013-10-16 DIAGNOSIS — IMO0002 Reserved for concepts with insufficient information to code with codable children: Secondary | ICD-10-CM | POA: Diagnosis not present

## 2013-10-16 DIAGNOSIS — J441 Chronic obstructive pulmonary disease with (acute) exacerbation: Secondary | ICD-10-CM | POA: Insufficient documentation

## 2013-10-16 DIAGNOSIS — E119 Type 2 diabetes mellitus without complications: Secondary | ICD-10-CM | POA: Insufficient documentation

## 2013-10-16 LAB — URINALYSIS, ROUTINE W REFLEX MICROSCOPIC
Bilirubin Urine: NEGATIVE
GLUCOSE, UA: NEGATIVE mg/dL
Hgb urine dipstick: NEGATIVE
KETONES UR: NEGATIVE mg/dL
Leukocytes, UA: NEGATIVE
Nitrite: NEGATIVE
PROTEIN: NEGATIVE mg/dL
Specific Gravity, Urine: 1.005 (ref 1.005–1.030)
Urobilinogen, UA: 0.2 mg/dL (ref 0.0–1.0)
pH: 7 (ref 5.0–8.0)

## 2013-10-16 MED ORDER — ALBUTEROL SULFATE HFA 108 (90 BASE) MCG/ACT IN AERS
INHALATION_SPRAY | RESPIRATORY_TRACT | Status: AC
  Filled 2013-10-16: qty 6.7

## 2013-10-16 MED ORDER — PREDNISONE 50 MG PO TABS
60.0000 mg | ORAL_TABLET | Freq: Once | ORAL | Status: DC
  Filled 2013-10-16 (×2): qty 1

## 2013-10-16 MED ORDER — ALBUTEROL SULFATE HFA 108 (90 BASE) MCG/ACT IN AERS
2.0000 | INHALATION_SPRAY | Freq: Once | RESPIRATORY_TRACT | Status: AC
  Administered 2013-10-16: 2 via RESPIRATORY_TRACT

## 2013-10-16 NOTE — ED Notes (Signed)
Pt has been out in the hallway multiple times saying she was going to leave. Discussed with pt that we were waiting on xray results and she was not discharged. Now, pt walking towards exit, pt says she was leaving. Asked pt to sign AMA, she continued to walk out

## 2013-10-16 NOTE — ED Notes (Signed)
Pt returned from xray. Pt says she does not want to take prednisone for pain because she will not be able to sleep tonight

## 2013-10-16 NOTE — ED Provider Notes (Signed)
CSN: [id number]     Arrival date & time 10/16/13  2103 History  This chart was scribed for Elwin Mocha, MD by Luisa Dago, ED Scribe. This patient was seen in room MH08/MH08 and the patient's care was started at 9:51 PM.    Chief Complaint  Patient presents with  . Abdominal Pain   Patient is a 68 y.o. female presenting with abdominal pain. The history is provided by the patient. No language interpreter was used.  Abdominal Pain Pain location:  Suprapubic Pain quality: aching   Pain radiates to:  Does not radiate Pain severity:  Mild Onset quality:  Gradual Duration:  4 days Timing:  Constant Progression:  Unchanged Chronicity:  Recurrent Relieved by:  Nothing Worsened by:  Nothing tried Ineffective treatments:  None tried Associated symptoms: shortness of breath   Associated symptoms: no chest pain, no chills, no cough, no fever, no nausea and no vomiting   Shortness of breath:    Severity:  Mild   Onset quality:  Gradual   Duration:  4 days   Timing:  Intermittent   Progression:  Unchanged  HPI Comments: Gina Boyd is a 68 y.o. female who presents to the Emergency Department complaining of abdominal pain that started approximately 4 days ago. Pt states that in the past when she had pain like this she was diagnosed with a really "bad infection". She is also complaining of associated dysuria, which has resolved, and intermittent SOB that also started 4 days ago. Pt states that she feels like she is experiencing chest pain that are mainly located to the left side of her chest. Denies any fever, chills, nausea, congestion, or headaches.    Past Medical History  Diagnosis Date  . Hypercholesteremia   . Lumbar vertebral fracture   . Chest pain   . Palpitations   . History of shingles 2013    right face  . Diabetes mellitus without complication   . Smoker   . COPD (chronic obstructive pulmonary disease)    Past Surgical History  Procedure Laterality Date  .  Cholecystectomy    . Abdominal hysterectomy      for bleeding, Complete hys  . Appendectomy    . 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 Hypertension Father   . Diabetes Father   . Heart disease Sister     CHF  . Hyperlipidemia Sister   . Diabetes Sister   . Colon cancer Mother     colon  . Hyperlipidemia Mother   . COPD Sister    History  Substance Use Topics  . Smoking status: Current Every Day Smoker -- 1.00 packs/day for 30 years    Types: Cigarettes  . Smokeless tobacco: Never Used  . Alcohol Use: No   OB History   Grav Para Term Preterm Abortions TAB SAB Ect Mult Living                 Review of Systems  Constitutional: Negative for fever and chills.  HENT: Negative for congestion.   Respiratory: Positive for shortness of breath. Negative for cough.   Cardiovascular: Negative for chest pain.  Gastrointestinal: Positive for abdominal pain. Negative for nausea and vomiting.  All other systems reviewed and are negative.  Allergies  Augmentin and Metformin and related  Home Medications   Prior to Admission medications   Medication Sig Start Date End Date Taking? Authorizing Provider  Aclidinium Bromide 400  MCG/ACT AEPB 1 puff bid. 03/31/13   Jade L Breeback, PA-C  albuterol (PROVENTIL HFA;VENTOLIN HFA) 108 (90 BASE) MCG/ACT inhaler Inhale 2 puffs into the lungs every 4 (four) hours as needed for wheezing. 03/09/13   Lattie HawStephen A Beese, MD  AMBULATORY NON FORMULARY MEDICATION Medication Name: glucometer with strips and lancets to test once a day. Dx.250.00 05/28/12   Agapito Gamesatherine D Metheney, MD  AMBULATORY NON FORMULARY MEDICATION Medication Name: shingles vaccine IM x 1 11/12/12   Agapito Gamesatherine D Metheney, MD  AMBULATORY NON FORMULARY MEDICATION Medication Name: The overnight pulse oximetry. Diagnosis hypoxemia. Short of breath. Please fax to home health. 03/25/13   Agapito Gamesatherine D Metheney, MD  cephALEXin (KEFLEX) 500 MG capsule 2 caps po bid x  7 days 08/23/13   Hurman HornJohn M Bednar, MD  clonazePAM (KLONOPIN) 0.5 MG tablet Take 0.5 mg by mouth 2 (two) times daily as needed for anxiety (Rx by psych).    Historical Provider, MD  cyclobenzaprine (FLEXERIL) 10 MG tablet take 1 tablet by mouth every 8 hours if needed for muscle spasm 12/06/12   Historical Provider, MD  Diphenhyd-Hydrocort-Nystatin (FIRST-DUKES MOUTHWASH) SUSP Sig: Swish, gargle, and spit one to two teaspoonfuls every six hours as needed. May be swallowed if esophageal involvement. Shake well before using. 03/27/13   Agapito Gamesatherine D Metheney, MD  Fluticasone-Salmeterol (ADVAIR) 250-50 MCG/DOSE AEPB Inhale 1 puff into the lungs 2 (two) times daily. 04/02/13   Agapito Gamesatherine D Metheney, MD  glipiZIDE (GLUCOTROL) 10 MG tablet take 1 tablet by mouth twice a day before meals 08/07/13   Agapito Gamesatherine D Metheney, MD  HYDROcodone-acetaminophen (NORCO/VICODIN) 5-325 MG per tablet Take 1 tablet by mouth 2 (two) times daily as needed for moderate pain. Take with food. 05/29/13   Agapito Gamesatherine D Metheney, MD  LYRICA 200 MG capsule take 1 capsule by mouth three times a day    Agapito Gamesatherine D Metheney, MD  meloxicam (MOBIC) 15 MG tablet Take 1 tablet (15 mg total) by mouth daily. 02/12/13   Jade L Breeback, PA-C  NITROSTAT 0.3 MG SL tablet place 1 tablet under the tongue every 5 MINUTES if needed    Jade L Breeback, PA-C  ondansetron (ZOFRAN) 4 MG tablet Take 1 tablet (4 mg total) by mouth every 8 (eight) hours as needed for nausea or vomiting. 06/03/13   Agapito Gamesatherine D Metheney, MD  oxyCODONE-acetaminophen (PERCOCET) 5-325 MG per tablet Take 2 tablets by mouth every 8 (eight) hours as needed for severe pain. 08/23/13   Hurman HornJohn M Bednar, MD  pravastatin (PRAVACHOL) 40 MG tablet take 1 tablet by mouth at bedtime 01/05/13   Agapito Gamesatherine D Metheney, MD  promethazine (PHENERGAN) 25 MG tablet take 1 tablet by mouth every 8 hours if needed for nausea and vomiting    Agapito Gamesatherine D Metheney, MD  QUEtiapine (SEROQUEL) 100 MG tablet Take 100 mg by mouth  at bedtime.    Historical Provider, MD  tiotropium (SPIRIVA) 18 MCG inhalation capsule Place 1 capsule (18 mcg total) into inhaler and inhale daily. 04/04/13   Jade L Breeback, PA-C  topiramate (TOPAMAX) 200 MG tablet Take 200 mg by mouth daily.     Historical Provider, MD  traMADol-acetaminophen (ULTRACET) 37.5-325 MG per tablet 1 or 2 every 4-6 hours as needed for moderate-severe pain.  Caution: May cause drowsiness. Any further refills would need to be done by your PCP or a chronic pain specialist 10/09/13   Lajean Manesavid Massey, MD  zolpidem (AMBIEN) 10 MG tablet Take 10 mg by mouth at bedtime as needed for  sleep (rx by psych).    Historical Provider, MD   BP 123/70  Pulse 106  Temp(Src) 98.3 F (36.8 C) (Oral)  Ht 5\' 8"  (1.727 m)  Wt 140 lb (63.504 kg)  BMI 21.29 kg/m2  SpO2 98%  Physical Exam  Nursing note and vitals reviewed. Constitutional: She is oriented to person, place, and time. She appears well-developed and well-nourished. No distress.  HENT:  Head: Normocephalic and atraumatic.  Mouth/Throat: Oropharynx is clear and moist.  Eyes: EOM are normal. Pupils are equal, round, and reactive to light.  Neck: Normal range of motion. Neck supple.  Cardiovascular: Normal rate and regular rhythm.  Exam reveals no friction rub.   No murmur heard. Pulmonary/Chest: Effort normal. No respiratory distress. She has wheezes (LLL). She has no rales.  Abdominal: Soft. She exhibits no distension. There is no tenderness. There is no rebound.  Musculoskeletal: Normal range of motion. She exhibits no edema.  Neurological: She is alert and oriented to person, place, and time.  Skin: Skin is warm and dry. No rash noted. She is not diaphoretic.  Psychiatric: She has a normal mood and affect.    ED Course  Procedures (including critical care time)  DIAGNOSTIC STUDIES: Oxygen Saturation is 98% on RA, normal by my interpretation.    COORDINATION OF CARE: 9:54 PM- Pt advised of plan for treatment and pt  agrees.  Labs Review Labs Reviewed  URINALYSIS, ROUTINE W REFLEX MICROSCOPIC    Imaging Review No results found.   EKG Interpretation None      MDM   Final diagnoses:  Wheezing  Chronic abdominal pain    24F here with abdominal pain. See me reason for her chronic abdominal pain, multiple meds in the chart about pain seeking behaviors. For me she says she's also having difficulty breathing and wheezing. She has history of COPD. Shoulder are not hurting for pain.. On exam she does have some mild lower mild tenderness is smiling and joking oral doing an abdominal exam, no peritoneal signs. Extremities. Chest x-ray ordered because of some wheezing. Given an albuterol inhaler. She refused steroids. She left AMA without me having a chance to talk her.  I personally performed the services described in this documentation, which was scribed in my presence. The recorded information has been reviewed and is accurate.    Elwin Mocha, MD 10/16/13 [phone]

## 2013-10-16 NOTE — ED Notes (Signed)
Dr. Gwendolyn GrantWalden notified that the patient has left AMA

## 2013-10-16 NOTE — ED Notes (Signed)
Pt c/o RLQ pain and SOB that began on Monday and have gotten progressively worse.

## 2014-02-01 ENCOUNTER — Other Ambulatory Visit: Payer: Self-pay | Admitting: Physician Assistant

## 2014-04-12 ENCOUNTER — Other Ambulatory Visit: Payer: Self-pay | Admitting: Physician Assistant

## 2014-07-15 IMAGING — CR DG CHEST 2V
2 series · 2 of 2 positions shown · non-contrast
Comparison: 12/21/2012.

CLINICAL DATA: Cough and chest congestion. Right lower lobe
pneumonia. Smoker.

EXAM:
CHEST  2 VIEW

[view not recorded (1 of 2)]
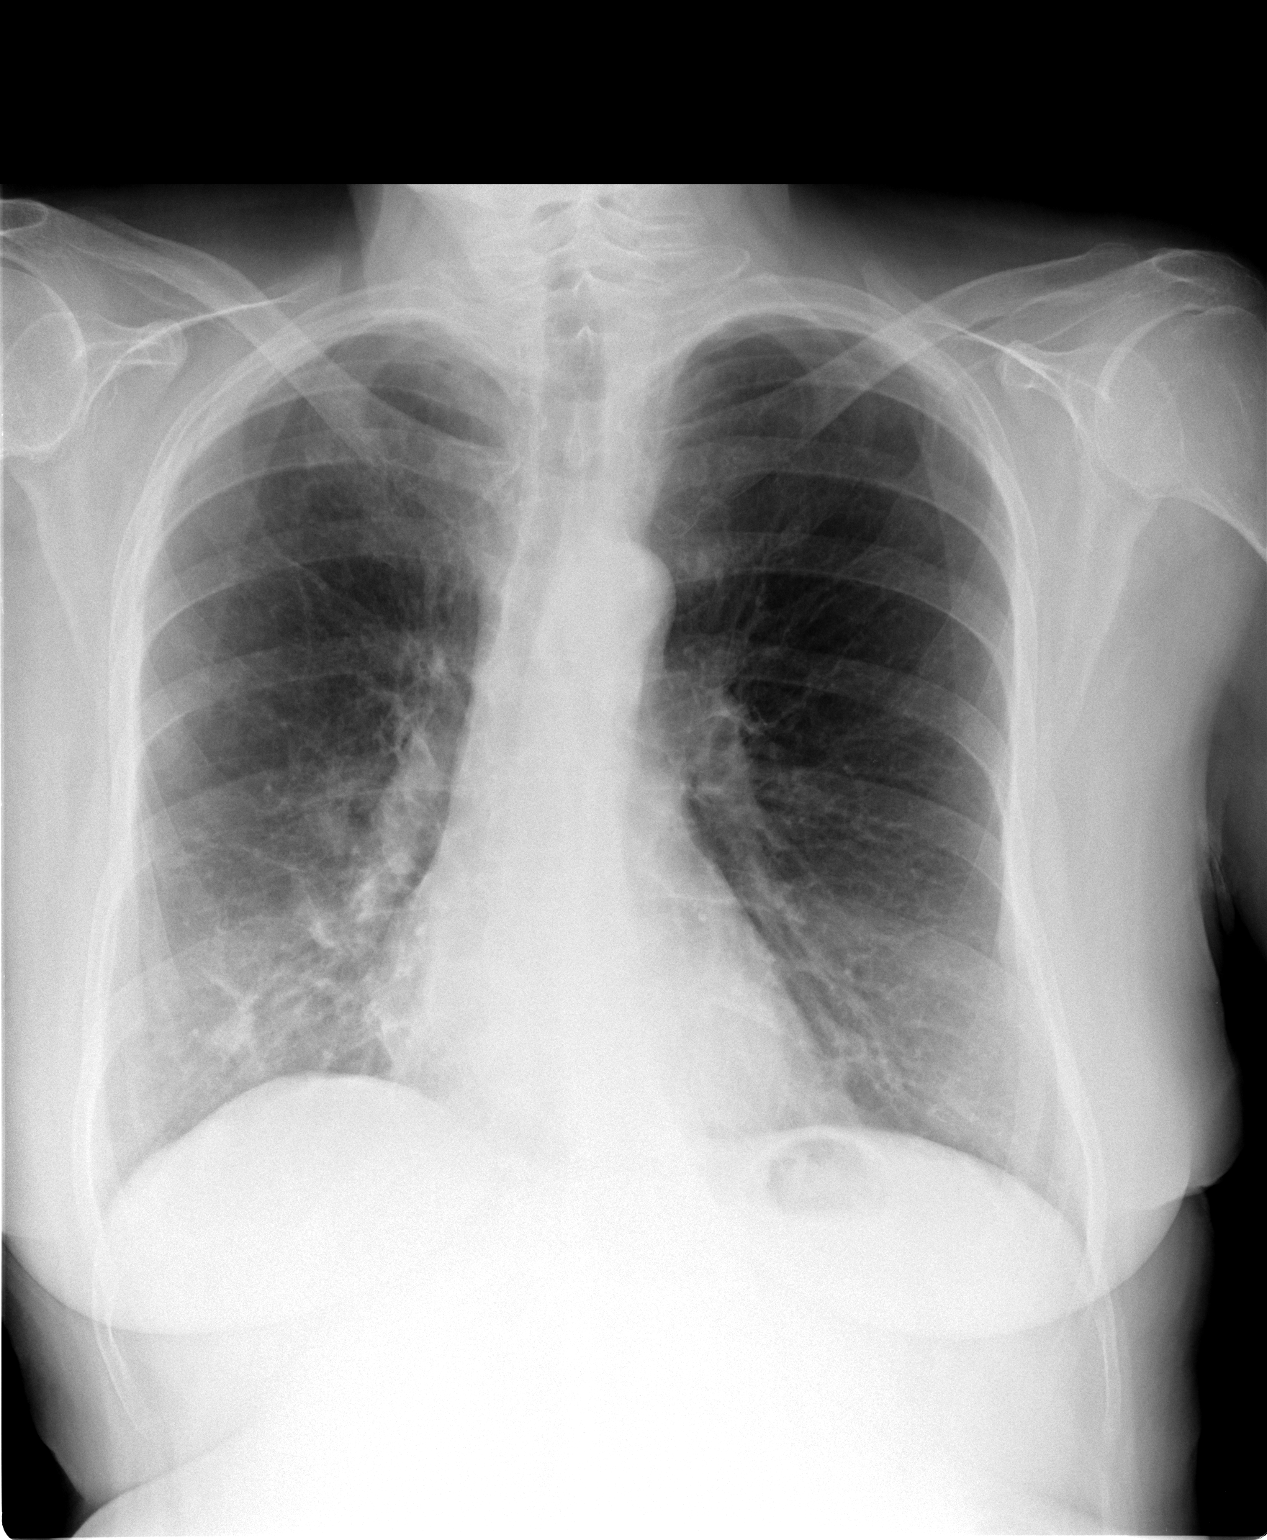

[view not recorded (2 of 2)]
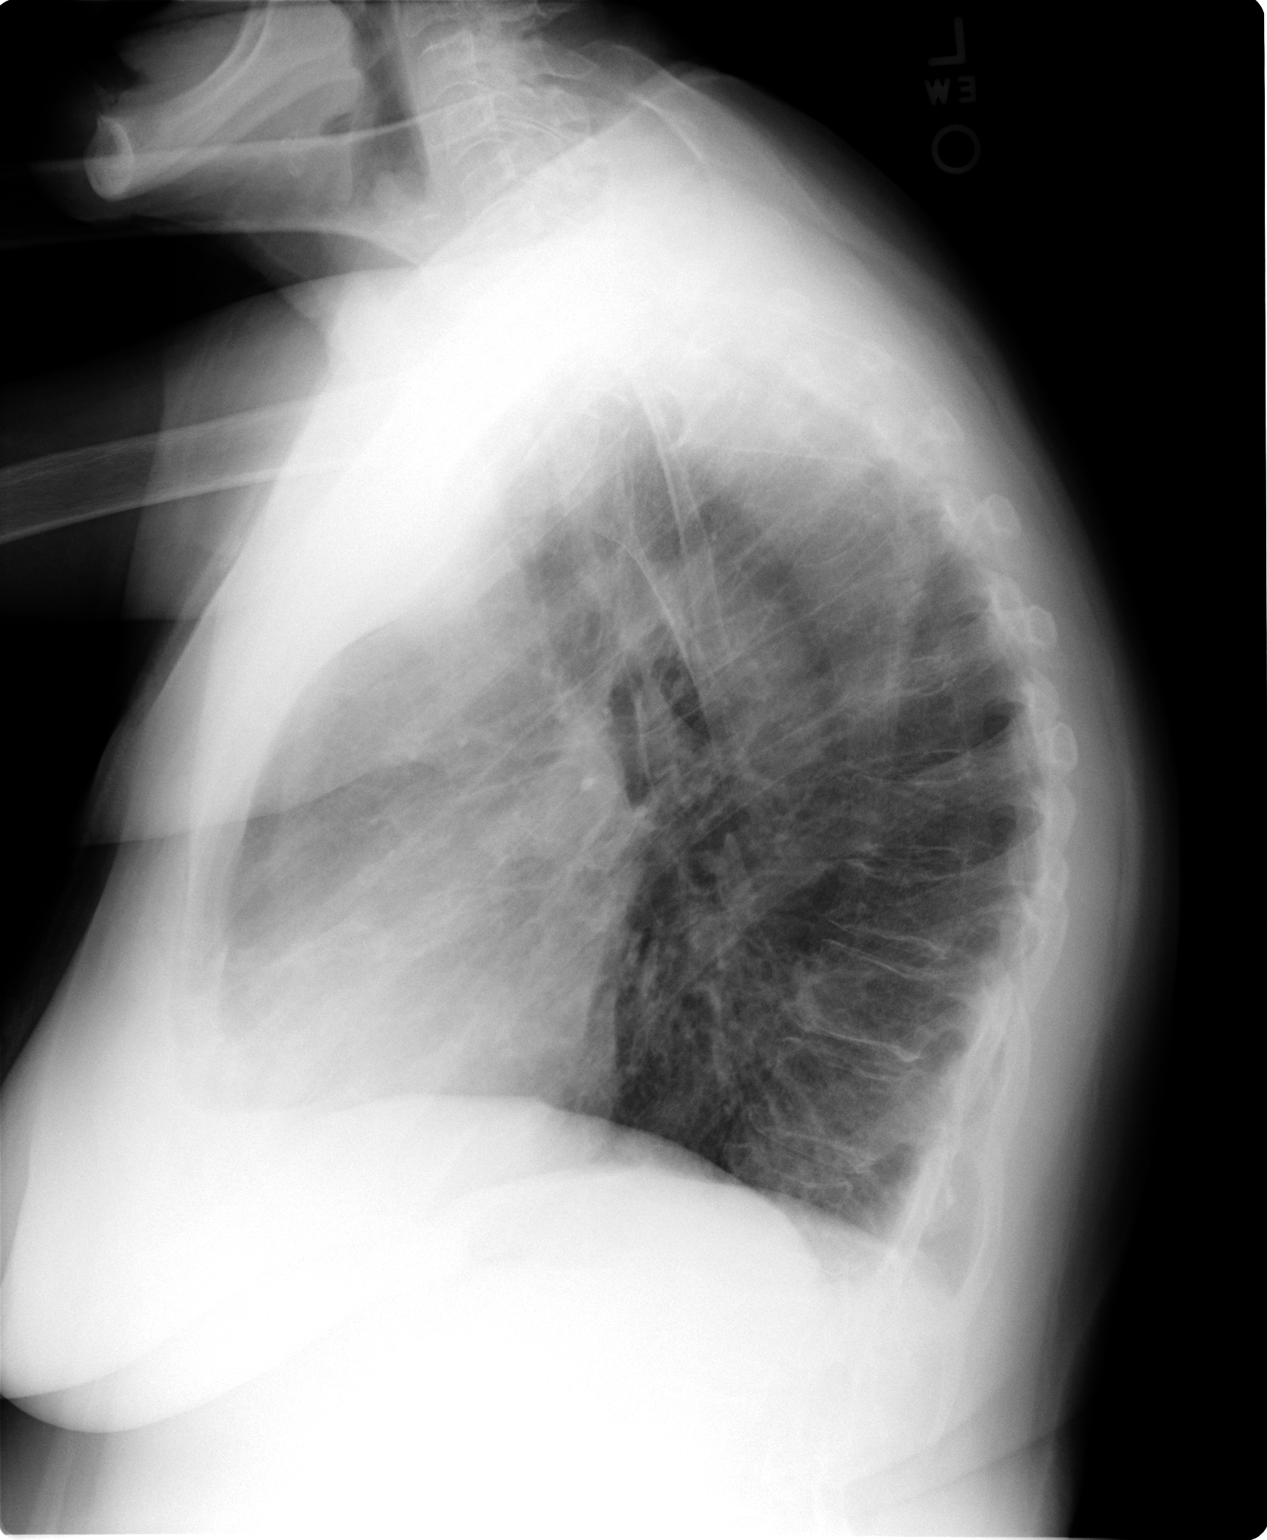

[2 of 2 positions shown; findings below may reference images not displayed]

FINDINGS: Normal sized heart. Interval irregular nodular density at the right
lung base. The remainder of the lungs are clear and hyperexpanded
with stable mild prominence of the interstitial markings. Diffuse
osteopenia and mild scoliosis.
IMPRESSION: 1. Irregular nodular density at the right lung base, concerning for
the possibility of a primary lung carcinoma. Further evaluation with
a chest CT with contrast is recommended.
2. Stable changes of COPD.

## 2014-08-02 IMAGING — CT CT CHEST W/O CM
2 of 3 series · 15 of 36 positions shown, 18 images · IV contrast (APPLIED)
Comparison: DG CHEST 2 VIEW dated 04/29/2013;

CLINICAL DATA: Possible nodule or mass on prior exam, pneumonia

EXAM:
CT CHEST WITHOUT CONTRAST
TECHNIQUE: Multidetector CT imaging of the chest was performed following the
standard protocol without IV contrast.

[Series 2: chest 5.0 b31f · axial · 0.56mm/px · z∈[-205,+20]mm · 12 of 53 slices shown, 15 images]
[im 4/53  mediastinal]
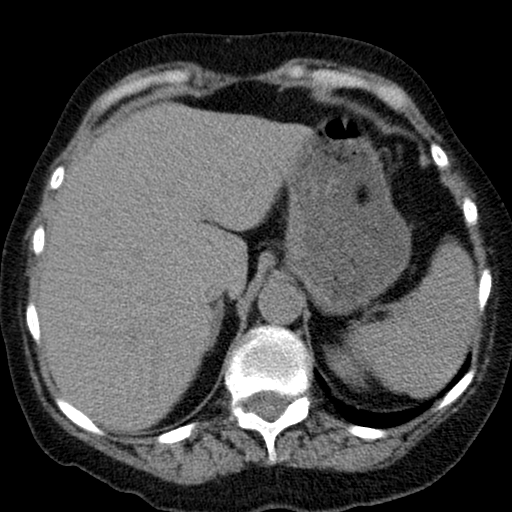
[im 4/53  lung]
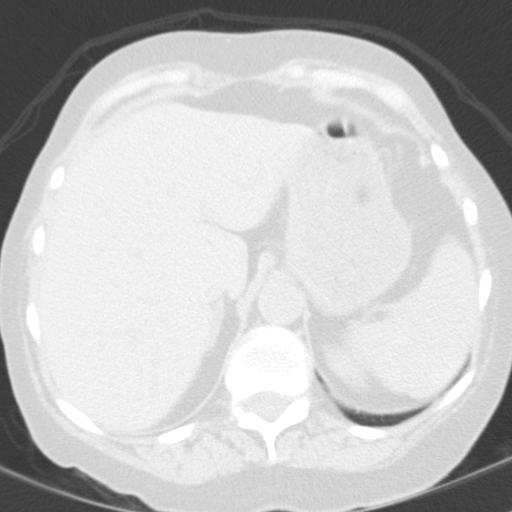
[im 8/53  lung]
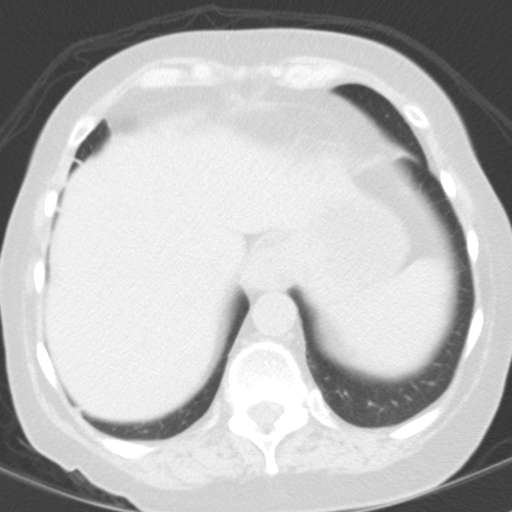
[im 12/53  lung]
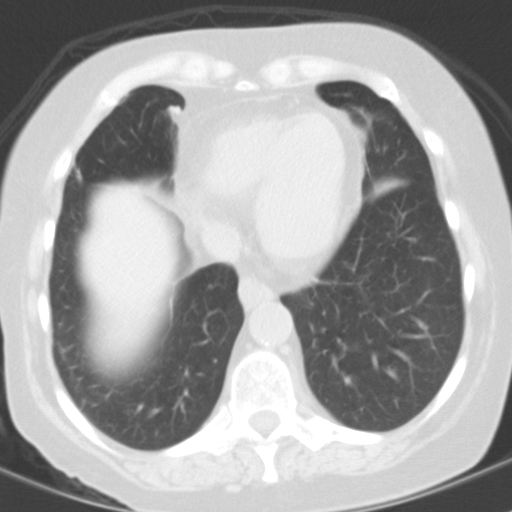
[im 16/53  lung]
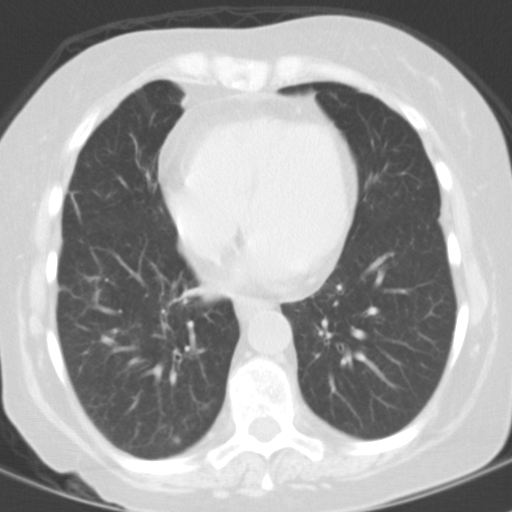
[im 20/53  mediastinal]
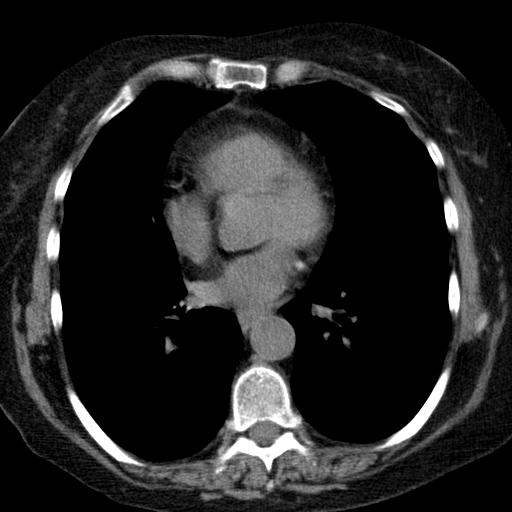
[im 20/53  lung]
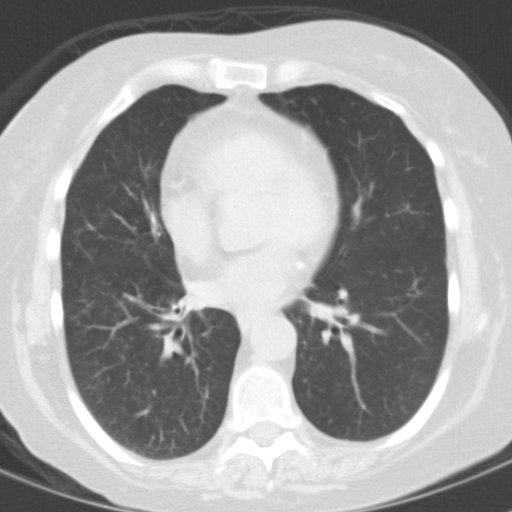
[im 24/53  lung]
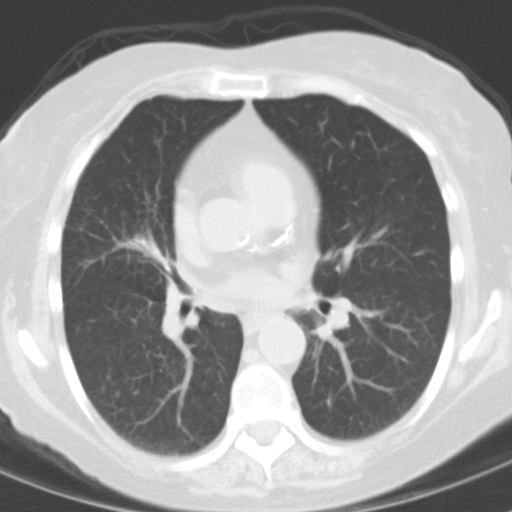
[im 29/53  lung]
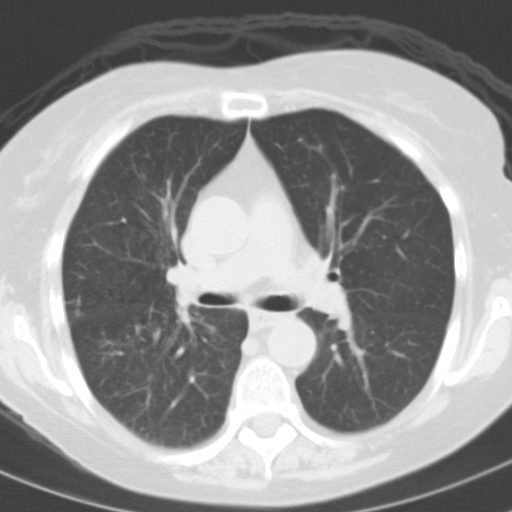
[im 33/53  lung]
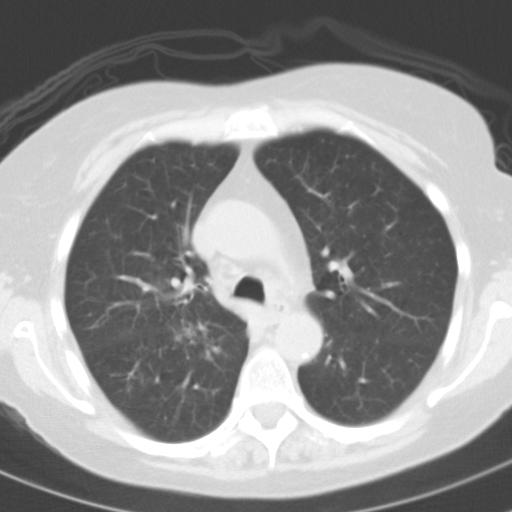
[im 37/53  mediastinal]
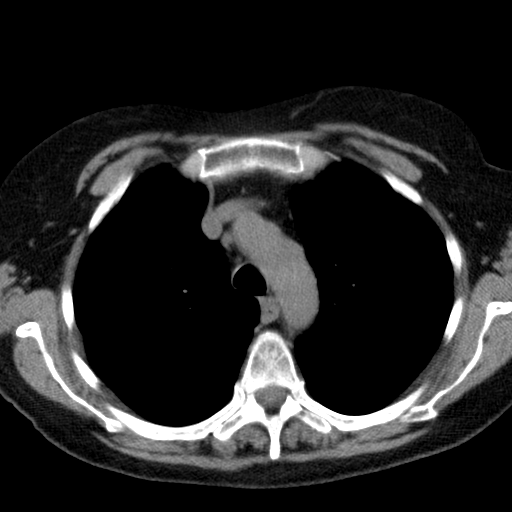
[im 37/53  lung]
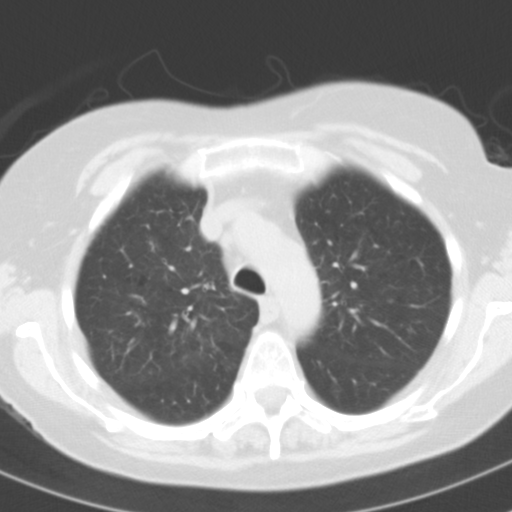
[im 41/53  lung]
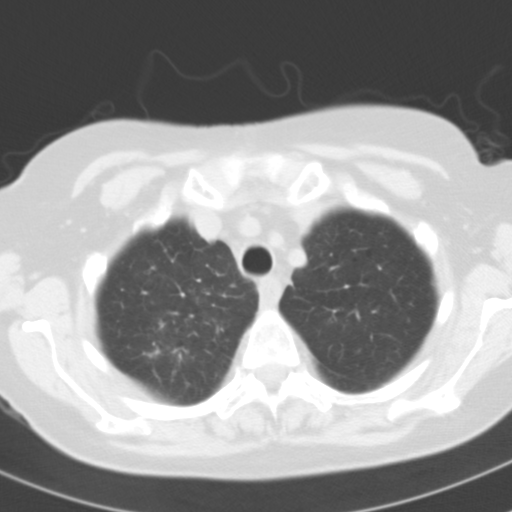
[im 45/53  lung]
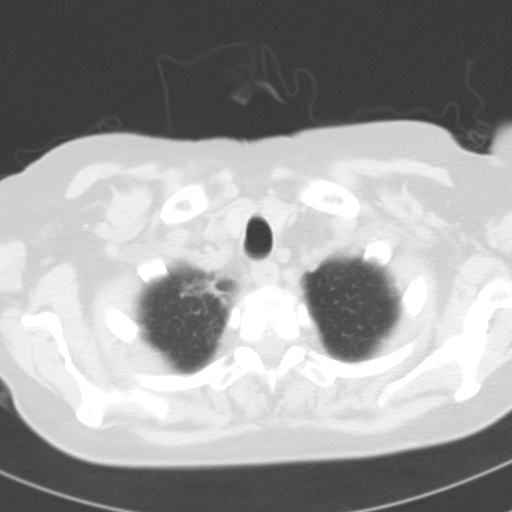
[im 49/53  lung]
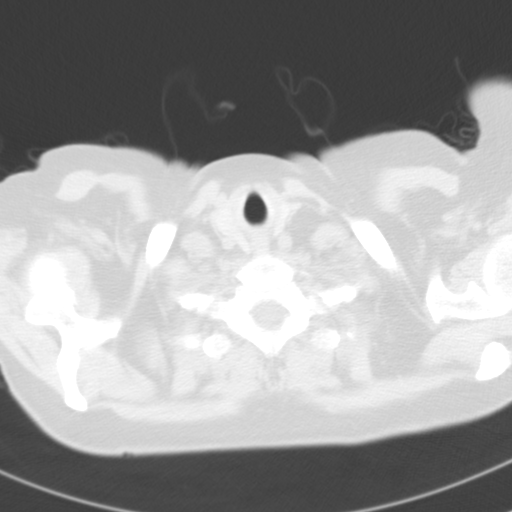

[Series 6: chest 3.0 coronal · coronal · 0.65mm/px · 3 of 77 slices shown]
[im 16/77  lung]
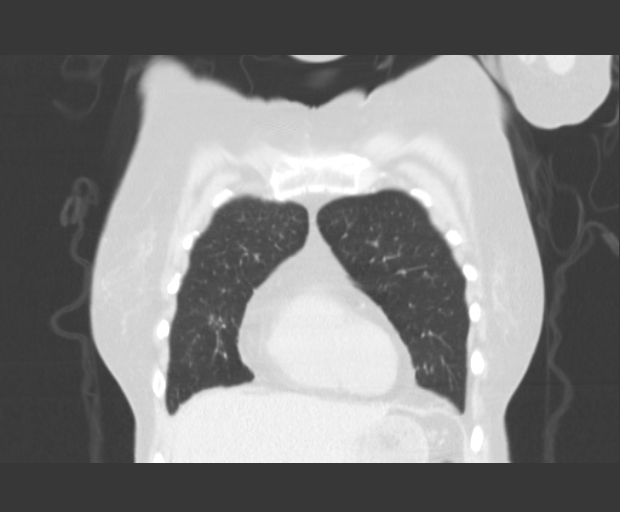
[im 31/77  lung]
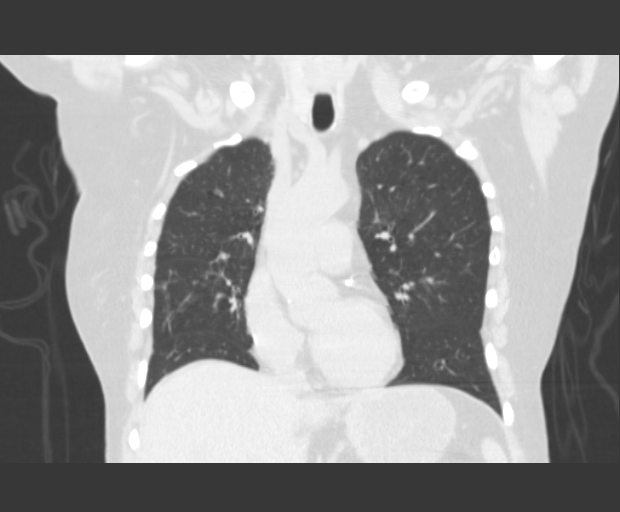
[im 46/77  lung]
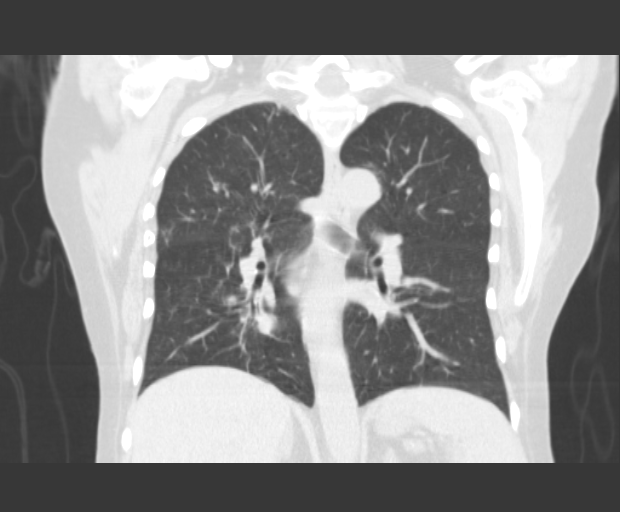

[15 of 36 positions shown; findings below may reference images not displayed]

NM PET GALEIS OZTAIZA SKULL
BASE T - THIGH dated 11/01/2012; CT ANGIO CHEST dated 10/24/2012
FINDINGS: 1 cm superior segment left lower lobe pulmonary nodule image 18 is
unchanged allowing for differences in technique. Curvilinear
bilateral lower lobe atelectasis or scarring noted as well as right
middle lobe and lingular scarring. A few areas of predominantly
right upper lobe and right lower lobe tree-in-bud type nodular
airspace opacity, image 30, and mild bronchial wall thickening with
inspissated secretions are noted. Apart from stable right middle
lobe scarring and or atelectasis image 42, there is no other
abnormality to account for the history of the previously questioned
nodule. No pleural effusion.

Great vessels are normal in caliber. No lymphadenopathy. No
pericardial effusion. Heart size is normal. No acute osseous
abnormality. Mild superior endplate compression deformity
reidentified at T6 with multilevel disc degenerative change and
Schmorl's node formation.
IMPRESSION: Patchy areas of tree-in-bud type nodular airspace opacity suggesting
small airways infection in the right lung as above.

Stable superior segment left lower lobe 1 cm pulmonary nodule.
Followup chest CT without contrast is recommended October 2013.

## 2014-08-05 DEATH — deceased
